# Patient Record
Sex: Female | Born: 1990 | Race: White | Hispanic: No | Marital: Single | Smoking: Former smoker
Health system: Southern US, Community
[De-identification: ages and names within clinical notes are randomized; demographics above are authoritative.]

## PROBLEM LIST (undated history)

## (undated) ENCOUNTER — Inpatient Hospital Stay: Payer: Medicaid Other

## (undated) DIAGNOSIS — K219 Gastro-esophageal reflux disease without esophagitis: Secondary | ICD-10-CM

## (undated) DIAGNOSIS — N730 Acute parametritis and pelvic cellulitis: Secondary | ICD-10-CM

## (undated) DIAGNOSIS — I10 Essential (primary) hypertension: Secondary | ICD-10-CM

## (undated) DIAGNOSIS — F32A Depression, unspecified: Secondary | ICD-10-CM

## (undated) DIAGNOSIS — T7840XA Allergy, unspecified, initial encounter: Secondary | ICD-10-CM

## (undated) HISTORY — DX: Essential (primary) hypertension: I10

## (undated) HISTORY — DX: Depression, unspecified: F32.A

## (undated) HISTORY — PX: MANDIBLE RECONSTRUCTION: SHX431

## (undated) HISTORY — DX: Gastro-esophageal reflux disease without esophagitis: K21.9

## (undated) HISTORY — DX: Allergy, unspecified, initial encounter: T78.40XA

## (undated) HISTORY — PX: OVARIAN CYST REMOVAL: SHX89

---
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
## 2011-02-11 ENCOUNTER — Emergency Department (HOSPITAL_COMMUNITY)
Admission: EM | Admit: 2011-02-11 | Discharge: 2011-02-11 | Disposition: A | Discharge status: Home / Self Care | Payer: 59 | Attending: Emergency Medicine | Admitting: Emergency Medicine

## 2011-02-11 ENCOUNTER — Encounter: Payer: Self-pay | Admitting: Emergency Medicine

## 2011-02-11 ENCOUNTER — Emergency Department (HOSPITAL_COMMUNITY): Payer: 59

## 2011-02-11 DIAGNOSIS — R110 Nausea: Secondary | ICD-10-CM | POA: Insufficient documentation

## 2011-02-11 DIAGNOSIS — R109 Unspecified abdominal pain: Secondary | ICD-10-CM | POA: Insufficient documentation

## 2011-02-11 DIAGNOSIS — N739 Female pelvic inflammatory disease, unspecified: Secondary | ICD-10-CM | POA: Insufficient documentation

## 2011-02-11 DIAGNOSIS — J45909 Unspecified asthma, uncomplicated: Secondary | ICD-10-CM | POA: Insufficient documentation

## 2011-02-11 LAB — URINALYSIS, ROUTINE W REFLEX MICROSCOPIC
Bilirubin Urine: NEGATIVE
Hgb urine dipstick: NEGATIVE
Protein, ur: NEGATIVE mg/dL
Urobilinogen, UA: 0.2 mg/dL (ref 0.0–1.0)

## 2011-02-11 LAB — DIFFERENTIAL
Eosinophils Absolute: 0.2 10*3/uL (ref 0.0–0.7)
Eosinophils Relative: 1 % (ref 0–5)
Lymphs Abs: 3.3 10*3/uL (ref 0.7–4.0)
Monocytes Absolute: 1.7 10*3/uL — ABNORMAL HIGH (ref 0.1–1.0)
Monocytes Relative: 9 % (ref 3–12)

## 2011-02-11 LAB — WET PREP, GENITAL: Trich, Wet Prep: NONE SEEN

## 2011-02-11 LAB — CBC
Hemoglobin: 12.8 g/dL (ref 12.0–15.0)
MCH: 30 pg (ref 26.0–34.0)
MCV: 88.5 fL (ref 78.0–100.0)
Platelets: 306 10*3/uL (ref 150–400)
RBC: 4.27 MIL/uL (ref 3.87–5.11)

## 2011-02-11 LAB — BASIC METABOLIC PANEL
BUN: 11 mg/dL (ref 6–23)
Calcium: 9.6 mg/dL (ref 8.4–10.5)
Creatinine, Ser: 0.78 mg/dL (ref 0.50–1.10)
GFR calc non Af Amer: >90 mL/min (ref >90)
Glucose, Bld: 76 mg/dL (ref 70–99)

## 2011-02-11 MED ORDER — CEFTRIAXONE SODIUM 250 MG IJ SOLR
INTRAMUSCULAR | Status: AC
  Filled 2011-02-11: qty 250

## 2011-02-11 MED ORDER — MORPHINE SULFATE 4 MG/ML IJ SOLN
4.0000 mg | Freq: Once | INTRAMUSCULAR | Status: AC
  Administered 2011-02-11: 4 mg via INTRAVENOUS
  Filled 2011-02-11: qty 1

## 2011-02-11 MED ORDER — MORPHINE SULFATE 4 MG/ML IJ SOLN
4.0000 mg | Freq: Once | INTRAMUSCULAR | Status: DC
  Filled 2011-02-11: qty 1

## 2011-02-11 MED ORDER — ONDANSETRON HCL 4 MG/2ML IJ SOLN
4.0000 mg | Freq: Once | INTRAMUSCULAR | Status: AC
  Administered 2011-02-11: 4 mg via INTRAVENOUS
  Filled 2011-02-11: qty 2

## 2011-02-11 MED ORDER — AZITHROMYCIN 250 MG PO TABS
ORAL_TABLET | ORAL | Status: AC
  Filled 2011-02-11: qty 4

## 2011-02-11 MED ORDER — HYDROCODONE-ACETAMINOPHEN 5-325 MG PO TABS: 2.0000 | ORAL_TABLET | ORAL | Status: AC | PRN

## 2011-02-11 MED ORDER — ONDANSETRON HCL 4 MG/2ML IJ SOLN: 4.0000 mg | Freq: Once | INTRAMUSCULAR | Status: DC

## 2011-02-11 MED ORDER — LIDOCAINE HCL (PF) 1 % IJ SOLN
INTRAMUSCULAR | Status: AC
  Administered 2011-02-11: 0.9 mL
  Filled 2011-02-11: qty 5

## 2011-02-11 MED ORDER — CEFTRIAXONE SODIUM 250 MG IJ SOLR
250.0000 mg | Freq: Once | INTRAMUSCULAR | Status: AC
  Administered 2011-02-11: 250 mg via INTRAMUSCULAR

## 2011-02-11 MED ORDER — DOXYCYCLINE HYCLATE 100 MG PO CAPS: 100.0000 mg | ORAL_CAPSULE | Freq: Two times a day (BID) | ORAL | Status: AC

## 2011-02-11 MED ORDER — AZITHROMYCIN 250 MG PO TABS
1000.0000 mg | ORAL_TABLET | Freq: Once | ORAL | Status: AC
  Administered 2011-02-11: 1000 mg via ORAL

## 2011-02-11 MED ORDER — ONDANSETRON HCL 4 MG PO TABS: 4.0000 mg | ORAL_TABLET | Freq: Three times a day (TID) | ORAL | Status: AC | PRN

## 2011-02-11 MED ORDER — MORPHINE SULFATE 4 MG/ML IJ SOLN
4.0000 mg | Freq: Once | INTRAMUSCULAR | Status: AC
  Administered 2011-02-11: 4 mg via INTRAVENOUS

## 2011-02-11 NOTE — ED Notes (Signed)
Pt moved from Pod A-1 to CDU 4

## 2011-02-11 NOTE — ED Notes (Signed)
PT. REPORTS LOW ABDOMINAL PAIN WITH NAUSEA , DENIES VOMITTING OR DIARRHEA ,   CHILLS WITH HEADACHE.

## 2011-02-11 NOTE — ED Notes (Signed)
PA IN TO DISCUSS RESULTS WITH PT 

## 2011-02-11 NOTE — ED Provider Notes (Signed)
History    this is a 20 year old female presenting to the ED with a chief complaints of abdominal pain. Patient states she experiencing gradual onset of low abdominal pain since yesterday while walking. Pain is described as sharp, worsened with movement, and constant. She is losing some nausea without vomiting or diarrhea. She denies fever, headache, chest pain, shortness of breath, back pain, dysuria, vaginal discharge, rash. She is sexually active and uses protection. She does not think she is pregnant. Her last menstrual period was 1 1/2 weeks ago and was normal.  CSN: [id number] Arrival date & time: 02/11/2011  5:52 AM   First MD Initiated Contact with Patient 02/11/11 [phone]      Chief Complaint  Patient presents with  . Abdominal Pain    (Consider location/radiation/quality/duration/timing/severity/associated sxs/prior treatment) HPI  Past Medical History  Diagnosis Date  . Asthma     History reviewed. No pertinent past surgical history.  No family history on file.  History  Substance Use Topics  . Smoking status: Current Everyday Smoker -- 1.0 packs/day  . Smokeless tobacco: Not on file  . Alcohol Use: Yes     OCCASIONAL    OB History    Grav Para Term Preterm Abortions TAB SAB Ect Mult Living                  Review of Systems  All other systems reviewed and are negative.    Allergies  Review of patient's allergies indicates no known allergies.  Home Medications   Current Outpatient Rx  Name Route Sig Dispense Refill  . ALBUTEROL SULFATE HFA 108 (90 BASE) MCG/ACT IN AERS Inhalation Inhale 2 puffs into the lungs every 6 (six) hours as needed. For shortness of breath or wheeze       BP 138/91  Pulse 78  Temp(Src) 97.5 F (36.4 C) (Oral)  Resp 14  SpO2 99%  LMP 01/28/2011  Physical Exam  Nursing note and vitals reviewed. Constitutional: She appears well-developed and well-nourished. No distress.  HENT:  Head: Normocephalic and atraumatic.  Eyes:  Conjunctivae are normal.  Neck: Normal range of motion. Neck supple.  Cardiovascular: Normal rate and regular rhythm.   Pulmonary/Chest: Effort normal and breath sounds normal. She exhibits no tenderness.  Abdominal: Soft. There is tenderness in the suprapubic area. There is no rigidity, no guarding, no tenderness at McBurney's point and negative Murphy's sign.  Genitourinary: Uterus normal. There is no rash or lesion on the right labia. There is no rash or lesion on the left labia. Cervix exhibits no motion tenderness, no discharge and no friability. Right adnexum displays tenderness. Right adnexum displays no mass. Left adnexum displays tenderness. Left adnexum displays no mass. There is tenderness around the vagina. No erythema or bleeding around the vagina. Vaginal discharge found.  Lymphadenopathy:       Right: No inguinal adenopathy present.       Left: No inguinal adenopathy present.    ED Course  Procedures (including critical care time)  Labs Reviewed  CBC - Abnormal; Notable for the following:    WBC 19.1 (*)    All other components within normal limits  DIFFERENTIAL - Abnormal; Notable for the following:    Neutro Abs 13.8 (*)    Monocytes Absolute 1.7 (*)    All other components within normal limits  BASIC METABOLIC PANEL  URINALYSIS, ROUTINE W REFLEX MICROSCOPIC  POCT PREGNANCY, URINE   No results found.   No diagnosis found.    MDM  Patient with lower abd pain and pain on pelvic exam. She has moderate discharge on pelvic exam. She has an elevated white count. She'll be treated for pelvic inflammatory disease, with ceftriaxone and Zithromax. She'll be discharged with doxycycline 1 week.  A pelvic ultrasound has been ordered to r/o ovarian torsion or tuboovarian abscess. If results normal I will consider an abdominal CT for further evaluation and to r/o appendicitis.  Patient denies prior history of STD.  11:41 AM Pelvic US shows no abnormalities concerning for  tuboovarian abscess or ovarian torsion. Patient were given the options of having an abdominal CT scan to rule out appendicitis. Patient refused CT scan at this time. I gave patient warning signs to return if the symptoms worsen. I will treat her for PID. She is able to tolerate by mouth at this time. She will be discharged with the appropriate medication.      Fayrene Helper, PA 02/11/11 1148

## 2011-02-11 NOTE — ED Notes (Signed)
Pt up ambulatory to the bathroom with no difficulty and no distress attempting to provide an urine specimen at this time

## 2011-02-11 NOTE — ED Notes (Signed)
Pelvic cart set up at bedside  

## 2011-02-11 NOTE — ED Notes (Signed)
Pt has undressed from waist down

## 2011-02-13 LAB — GC/CHLAMYDIA PROBE AMP, GENITAL
Chlamydia, DNA Probe: POSITIVE — AB
GC Probe Amp, Genital: NEGATIVE

## 2011-02-14 NOTE — ED Notes (Signed)
+   Chlamydia Patient treated with Zithromax and rocephin -sensitive to same-chart appended per protocol MD.

## 2011-02-14 NOTE — ED Provider Notes (Signed)
Evaluation and management procedures were performed by the PA/NP under my supervision/collaboration.    Kiwanna Spraker D Nimo Verastegui, MD 02/14/11 1528 

## 2011-02-17 NOTE — ED Notes (Signed)
Patient informed of positive results after id'd x 2 and informed of need to notify partner to be treated. 

## 2011-02-22 ENCOUNTER — Encounter (HOSPITAL_COMMUNITY): Payer: Self-pay | Admitting: Emergency Medicine

## 2011-02-22 ENCOUNTER — Emergency Department (HOSPITAL_COMMUNITY)
Admission: EM | Admit: 2011-02-22 | Discharge: 2011-02-23 | Disposition: A | Discharge status: Home / Self Care | Payer: No Typology Code available for payment source | Attending: Emergency Medicine | Admitting: Emergency Medicine

## 2011-02-22 ENCOUNTER — Emergency Department (HOSPITAL_COMMUNITY): Payer: No Typology Code available for payment source

## 2011-02-22 DIAGNOSIS — S161XXA Strain of muscle, fascia and tendon at neck level, initial encounter: Secondary | ICD-10-CM

## 2011-02-22 DIAGNOSIS — S139XXA Sprain of joints and ligaments of unspecified parts of neck, initial encounter: Secondary | ICD-10-CM | POA: Insufficient documentation

## 2011-02-22 DIAGNOSIS — Y92410 Unspecified street and highway as the place of occurrence of the external cause: Secondary | ICD-10-CM | POA: Insufficient documentation

## 2011-02-22 HISTORY — DX: Acute parametritis and pelvic cellulitis: N73.0

## 2011-02-22 MED ORDER — HYDROCODONE-ACETAMINOPHEN 5-500 MG PO TABS: 1.0000 | ORAL_TABLET | Freq: Four times a day (QID) | ORAL | Status: AC | PRN

## 2011-02-22 MED ORDER — HYDROCODONE-ACETAMINOPHEN 5-325 MG PO TABS
1.0000 | ORAL_TABLET | Freq: Once | ORAL | Status: AC
  Administered 2011-02-22: 1 via ORAL
  Filled 2011-02-22: qty 1

## 2011-02-22 MED ORDER — IBUPROFEN 600 MG PO TABS: ORAL_TABLET | ORAL | Status: DC
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
## 2011-06-09 ENCOUNTER — Ambulatory Visit (INDEPENDENT_AMBULATORY_CARE_PROVIDER_SITE_OTHER): Payer: No Typology Code available for payment source | Admitting: Family Medicine

## 2011-06-09 ENCOUNTER — Encounter: Payer: Self-pay | Admitting: Family Medicine

## 2011-06-09 VITALS — BP 100/78 | HR 60 | Temp 98.0°F | Ht 62.0 in | Wt 123.0 lb

## 2011-06-09 DIAGNOSIS — Z8742 Personal history of other diseases of the female genital tract: Secondary | ICD-10-CM

## 2011-06-09 DIAGNOSIS — N898 Other specified noninflammatory disorders of vagina: Secondary | ICD-10-CM

## 2011-06-09 HISTORY — DX: Personal history of other diseases of the female genital tract: Z87.42

## 2011-06-09 LAB — POCT URINALYSIS DIPSTICK
Ketones, UA: NEGATIVE
Nitrite, UA: POSITIVE
Protein, UA: NEGATIVE
Urobilinogen, UA: NEGATIVE
pH, UA: 6.5

## 2011-06-09 LAB — POCT URINE PREGNANCY: Preg Test, Ur: NEGATIVE

## 2011-06-09 MED ORDER — FLUCONAZOLE 150 MG PO TABS: ORAL_TABLET | ORAL | Status: DC

## 2011-06-09 NOTE — Progress Notes (Signed)
SUBJECTIVE:  21 y.o. female complains of white and copious vaginal discharge for 2 month(s). Denies abnormal vaginal bleeding or significant pelvic pain or fever. No UTI symptoms.   H/o PID  Patient Active Problem List  Diagnoses  . History of PID   Past Medical History  Diagnosis Date  . Asthma   . PID (acute pelvic inflammatory disease)    No past surgical history on file. History  Substance Use Topics  . Smoking status: Current Everyday Smoker  . Smokeless tobacco: Not on file   Comment: Occasional  . Alcohol Use: Yes     OCCASIONAL   Family History  Problem Relation Age of Onset  . Cancer Mother 6    breast   No Known Allergies Current Outpatient Prescriptions on File Prior to Visit  Medication Sig Dispense Refill  . albuterol (PROVENTIL HFA;VENTOLIN HFA) 108 (90 BASE) MCG/ACT inhaler Inhale 2 puffs into the lungs every 6 (six) hours as needed. For shortness of breath or wheeze       . ibuprofen (ADVIL,MOTRIN) 600 MG tablet One (1) po tid prn, with food  15 tablet  0  . ondansetron (ZOFRAN) 4 MG tablet Take 4 mg by mouth every 8 (eight) hours as needed. nausea        The PMH, PSH, Social History, Family History, Medications, and allergies have been reviewed in [facility], and have been updated if relevant.  Patient's last menstrual period was 05/21/2011.  OBJECTIVE:  BP 100/78  Pulse 60  Temp(Src) 98 F (36.7 C) (Oral)  Ht 5\' 2"  (1.575 m)  Wt 123 lb (55.792 kg)  BMI 22.50 kg/m2  LMP 05/21/2011  She appears well, afebrile. Abdomen: benign, soft, nontender, no masses. Pelvic Exam: normal external genitalia, vulva, vagina, cervix, uterus and adnexa, thick white discharge in vault, no CMT WET MOUNT done - results: mobiluncus noted. Urine dipstick: positive for leukocytes.  ASSESSMENT:  rule out GC or chlamydia and C. albicans vulvovaginitis  PLAN:  GC and chlamydia DNA  probe sent to lab. Treatment: abstain from coitus during course of treatment and  diflucan. ROV prn if symptoms persist or worsen. Pt declined RPR, HIV testing.

## 2011-06-09 NOTE — Patient Instructions (Signed)
It was great to meet you. We will call you with results from your tests today. Please take Diflucan as directed.

## 2011-07-24 ENCOUNTER — Emergency Department (HOSPITAL_COMMUNITY)
Admission: EM | Admit: 2011-07-24 | Discharge: 2011-07-24 | Disposition: A | Discharge status: Home / Self Care | Payer: 59 | Attending: Emergency Medicine | Admitting: Emergency Medicine

## 2011-07-24 ENCOUNTER — Encounter (HOSPITAL_COMMUNITY): Payer: Self-pay | Admitting: *Deleted

## 2011-07-24 DIAGNOSIS — F411 Generalized anxiety disorder: Secondary | ICD-10-CM | POA: Insufficient documentation

## 2011-07-24 DIAGNOSIS — J45909 Unspecified asthma, uncomplicated: Secondary | ICD-10-CM | POA: Insufficient documentation

## 2011-07-24 DIAGNOSIS — F17200 Nicotine dependence, unspecified, uncomplicated: Secondary | ICD-10-CM | POA: Insufficient documentation

## 2011-07-24 DIAGNOSIS — F410 Panic disorder [episodic paroxysmal anxiety] without agoraphobia: Secondary | ICD-10-CM

## 2011-07-24 NOTE — ED Notes (Signed)
Bed:WA13<BR> Expected date:<BR> Expected time:<BR> Means of arrival:<BR> Comments:<BR> EMS

## 2011-07-24 NOTE — ED Notes (Signed)
Per EMS: Pt has been drinking since 12pm and then snorted something that she was told was cocaine around 2 hours ago.  Pt st's wasn't herself after snorting the drug and started having a panic attack, st's she couldn't breathe.

## 2011-07-24 NOTE — Discharge Instructions (Signed)
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
## 2011-07-31 ENCOUNTER — Emergency Department: Payer: Self-pay | Admitting: *Deleted

## 2011-07-31 ENCOUNTER — Other Ambulatory Visit: Payer: Self-pay

## 2011-07-31 MED ORDER — ALBUTEROL SULFATE HFA 108 (90 BASE) MCG/ACT IN AERS: 2.0000 | INHALATION_SPRAY | Freq: Four times a day (QID) | RESPIRATORY_TRACT | Status: DC | PRN

## 2011-07-31 NOTE — Telephone Encounter (Signed)
Pts mother left v/m requesting Ventolin inhaler(used prn) to [facility]. Wants to pick up today.Please advise.

## 2011-08-12 ENCOUNTER — Other Ambulatory Visit: Payer: Self-pay | Admitting: *Deleted

## 2011-08-13 ENCOUNTER — Other Ambulatory Visit: Payer: Self-pay | Admitting: *Deleted

## 2011-08-13 DIAGNOSIS — J45909 Unspecified asthma, uncomplicated: Secondary | ICD-10-CM

## 2011-08-13 NOTE — Telephone Encounter (Signed)
Pharmacy is asking for proventil nebulizer solution for patient.  This isn't on med list.  I called her mother, she says pt went to ER a couple of weeks ago with breathing problems, left without being seen.  Mother says she has a history of having mild asthma, has used a nebulizer in the past.  Pt is going on vacation and wants to have this on hand when she goes.  Form form [facility] is on your desk.

## 2011-08-14 DIAGNOSIS — J45909 Unspecified asthma, uncomplicated: Secondary | ICD-10-CM | POA: Insufficient documentation

## 2011-08-14 DIAGNOSIS — J4540 Moderate persistent asthma, uncomplicated: Secondary | ICD-10-CM | POA: Insufficient documentation

## 2011-08-14 NOTE — Telephone Encounter (Signed)
Formed filled out and in my box.

## 2011-08-14 NOTE — Telephone Encounter (Signed)
Form faxed

## 2011-09-23 ENCOUNTER — Other Ambulatory Visit: Payer: Self-pay | Admitting: Family Medicine

## 2011-11-04 ENCOUNTER — Telehealth: Payer: Self-pay

## 2011-11-04 NOTE — Telephone Encounter (Signed)
Pts nebulizer has broken(pt has had for at least 10 years);pts mother spoke with [facility] and request an order for new nebulizer.call when ready for pick up.

## 2011-11-05 ENCOUNTER — Other Ambulatory Visit: Payer: Self-pay | Admitting: *Deleted

## 2011-11-05 MED ORDER — ALBUTEROL SULFATE HFA 108 (90 BASE) MCG/ACT IN AERS: INHALATION_SPRAY | RESPIRATORY_TRACT | Status: DC

## 2011-11-05 NOTE — Telephone Encounter (Signed)
Rx in my box.

## 2011-11-05 NOTE — Telephone Encounter (Signed)
Advised mother order is ready for pick up.

## 2011-11-21 ENCOUNTER — Encounter: Payer: Self-pay | Admitting: Family Medicine

## 2011-11-21 ENCOUNTER — Ambulatory Visit (INDEPENDENT_AMBULATORY_CARE_PROVIDER_SITE_OTHER): Payer: 59 | Admitting: Family Medicine

## 2011-11-21 VITALS — BP 120/68 | HR 96 | Temp 98.2°F | Resp 20 | Ht 62.0 in | Wt 126.2 lb

## 2011-11-21 DIAGNOSIS — J329 Chronic sinusitis, unspecified: Secondary | ICD-10-CM

## 2011-11-21 DIAGNOSIS — J40 Bronchitis, not specified as acute or chronic: Secondary | ICD-10-CM

## 2011-11-21 MED ORDER — AZITHROMYCIN 250 MG PO TABS: ORAL_TABLET | ORAL | Status: DC

## 2011-11-21 NOTE — Progress Notes (Signed)
SUBJECTIVE:  Kathryn Bush is a 21 y.o. female who complains of coryza, congestion, sneezing, sore throat, headache and bilateral sinus pain for 9 days. She denies a history of anorexia, chest pain, shortness of breath, sweats, vomiting and weakness and has a history of asthma. Patient denies smoke cigarettes.   Patient Active Problem List  Diagnosis  . History of PID  . Asthma   Past Medical History  Diagnosis Date  . Asthma   . PID (acute pelvic inflammatory disease)    No past surgical history on file. History  Substance Use Topics  . Smoking status: Former Smoker    Start date: 09/25/2011  . Smokeless tobacco: Not on file   Comment: Occasional  . Alcohol Use: Yes     OCCASIONAL   Family History  Problem Relation Age of Onset  . Cancer Mother 76    breast   No Known Allergies Current Outpatient Prescriptions on File Prior to Visit  Medication Sig Dispense Refill  . albuterol (PROVENTIL HFA) 108 (90 BASE) MCG/ACT inhaler Inhale 2 puffs into the lungs every 6 hours as needed for shortness of breath or wheezing.  3 Inhaler  0  . albuterol (PROVENTIL) (2.5 MG/3ML) 0.083% nebulizer solution Take 2.5 mg by nebulization every 6 (six) hours as needed.       The PMH, PSH, Social History, Family History, Medications, and allergies have been reviewed in [facility], and have been updated if relevant.  OBJECTIVE: BP 120/68  Pulse 96  Temp 98.2 F (36.8 C) (Oral)  Resp 20  Ht 5\' 2"  (1.575 m)  Wt 126 lb 4 oz (57.267 kg)  BMI 23.09 kg/m2  SpO2 97%  She appears well, vital signs are as noted. Ears normal.  Throat and pharynx normal.  Neck supple. No adenopathy in the neck. Nose is congested. Sinuses  Tender throughout. Left scattered wheezes, faint, otherwise clear.  ASSESSMENT:  sinusitis and bronchitis  PLAN: Given duration and progression of symptoms, will treat for bacterial sinusitis/bronchitis with Zpack. Discussed local increased macrolide resistance.  She will call us next  week if symptoms appear to be progressing and not improving.  Symptomatic therapy suggested: push fluids and rest. Call or return to clinic prn if these symptoms worsen or fail to improve as anticipated.

## 2011-11-21 NOTE — Patient Instructions (Addendum)
Take Zpack as directed.  Drink lots of fluids.  Treat sympotmatically with Mucinex, nasal saline irrigation, and Tylenol/Ibuprofen. Also try claritin D or zyrtec D over the counter- two times a day as needed ( have to sign for them at pharmacy). You can use warm compresses.  Cough suppressant at night. Call if not improving as expected in 5-7 days.    

## 2011-11-24 ENCOUNTER — Telehealth: Payer: Self-pay | Admitting: *Deleted

## 2011-11-24 MED ORDER — BENZONATATE 200 MG PO CAPS: 200.0000 mg | ORAL_CAPSULE | Freq: Two times a day (BID) | ORAL | Status: DC | PRN

## 2011-11-24 NOTE — Telephone Encounter (Signed)
Spoke with patient, she says she has a dry cough that is worse than it has been, sometimes coughs so much that she feels short of breath.  No fever. Would like something for the cough called to [facility] [address]  She is taking mucinex and drinking plenty of fluids.

## 2011-11-24 NOTE — Telephone Encounter (Signed)
Pt was seen on Friday for URI and her mother states she was told to call today if not any better, and she says pt is not.  Still with same symptoms as Friday.  Taking zithromax since Friday.   Please advise, uses [facility] [address]

## 2011-11-24 NOTE — Telephone Encounter (Signed)
Noted.  Rx for tessalon sent to her pharmacy.

## 2011-11-24 NOTE — Telephone Encounter (Signed)
Advised patient's mother, she says pt is worse.  She says that you had told her pt could get a stronger abx if needed today.  She hasn't seen patient but says she sounds much worse then she did.

## 2011-11-24 NOTE — Telephone Encounter (Signed)
It takes 5-7 days for symptoms to resolve.  As long as she is not worse, please continue Zpack and supportive care as directed.

## 2011-11-24 NOTE — Telephone Encounter (Signed)
Left message on cell phone asking patient to call be back.

## 2011-11-24 NOTE — Telephone Encounter (Signed)
Left message advising pt to check with her pharmacy regarding script.

## 2011-11-24 NOTE — Telephone Encounter (Signed)
Please speak to patient directly and ask her how she is doing. We need to hear how she is doing from her.

## 2011-12-23 ENCOUNTER — Emergency Department (HOSPITAL_COMMUNITY): Payer: 59

## 2011-12-23 ENCOUNTER — Encounter (HOSPITAL_COMMUNITY): Payer: Self-pay | Admitting: *Deleted

## 2011-12-23 ENCOUNTER — Emergency Department (HOSPITAL_COMMUNITY)
Admission: EM | Admit: 2011-12-23 | Discharge: 2011-12-24 | Disposition: A | Discharge status: Home / Self Care | Payer: 59 | Attending: Emergency Medicine | Admitting: Emergency Medicine

## 2011-12-23 DIAGNOSIS — R0602 Shortness of breath: Secondary | ICD-10-CM | POA: Insufficient documentation

## 2011-12-23 DIAGNOSIS — R062 Wheezing: Secondary | ICD-10-CM | POA: Insufficient documentation

## 2011-12-23 DIAGNOSIS — R0789 Other chest pain: Secondary | ICD-10-CM | POA: Insufficient documentation

## 2011-12-23 DIAGNOSIS — Z79899 Other long term (current) drug therapy: Secondary | ICD-10-CM | POA: Insufficient documentation

## 2011-12-23 DIAGNOSIS — F17200 Nicotine dependence, unspecified, uncomplicated: Secondary | ICD-10-CM | POA: Insufficient documentation

## 2011-12-23 DIAGNOSIS — Z87441 Personal history of nephrotic syndrome: Secondary | ICD-10-CM | POA: Insufficient documentation

## 2011-12-23 DIAGNOSIS — J45901 Unspecified asthma with (acute) exacerbation: Secondary | ICD-10-CM

## 2011-12-23 LAB — BASIC METABOLIC PANEL
BUN: 3 mg/dL — ABNORMAL LOW (ref 6–23)
Calcium: 9.5 mg/dL (ref 8.4–10.5)
Chloride: 98 mEq/L (ref 96–112)
Creatinine, Ser: 0.59 mg/dL (ref 0.50–1.10)
GFR calc Af Amer: >90 mL/min (ref >90)

## 2011-12-23 LAB — CBC
HCT: 38 % (ref 36.0–46.0)
MCH: 29.6 pg (ref 26.0–34.0)
MCV: 86.6 fL (ref 78.0–100.0)
Platelets: 277 10*3/uL (ref 150–400)
RDW: 14.4 % (ref 11.5–15.5)
WBC: 16.3 10*3/uL — ABNORMAL HIGH (ref 4.0–10.5)

## 2011-12-23 MED ORDER — PREDNISONE 20 MG PO TABS
60.0000 mg | ORAL_TABLET | Freq: Once | ORAL | Status: AC
  Administered 2011-12-23: 60 mg via ORAL
  Filled 2011-12-23: qty 3

## 2011-12-23 MED ORDER — IPRATROPIUM BROMIDE 0.02 % IN SOLN
0.5000 mg | RESPIRATORY_TRACT | Status: DC
  Administered 2011-12-23 (×2): 0.5 mg via RESPIRATORY_TRACT
  Filled 2011-12-23 (×2): qty 2.5

## 2011-12-23 MED ORDER — IPRATROPIUM BROMIDE 0.02 % IN SOLN
0.5000 mg | Freq: Once | RESPIRATORY_TRACT | Status: AC
  Administered 2011-12-23: 0.5 mg via RESPIRATORY_TRACT
  Filled 2011-12-23: qty 2.5

## 2011-12-23 MED ORDER — ALBUTEROL SULFATE (5 MG/ML) 0.5% IN NEBU
10.0000 mg | INHALATION_SOLUTION | Freq: Once | RESPIRATORY_TRACT | Status: AC
  Administered 2011-12-23: 10 mg via RESPIRATORY_TRACT

## 2011-12-23 MED ORDER — ALBUTEROL SULFATE (5 MG/ML) 0.5% IN NEBU
2.5000 mg | INHALATION_SOLUTION | RESPIRATORY_TRACT | Status: DC
  Administered 2011-12-23 (×2): 2.5 mg via RESPIRATORY_TRACT
  Filled 2011-12-23 (×2): qty 0.5

## 2011-12-23 NOTE — ED Provider Notes (Signed)
History   This chart was scribed for non-physician practitioner working with Lyanne Co, MD by Greer Ee. This patient was seen in room WTR6/WTR6 and the patient's care was started at 20:56.    CSN: [id number]  Arrival date & time 12/23/11  2048   First MD Initiated Contact with Patient 12/23/11 2055      Chief Complaint  Patient presents with  . Asthma    (Consider location/radiation/quality/duration/timing/severity/associated sxs/prior treatment) The history is provided by the patient. No language interpreter was used.   Kathryn Bush is a 21 y.o. female with h/o asthma who presents to the Emergency Department complaining of 2 days of constant dyspnea.  Pt states asthma is usually easily controlled by albuterol inhaler, which she uses regularly at baseline, and when dyspnea gets more severe she uses nebulizer with improvements, however current dyspnea has not been improved by inhaler or nebulizer.  Pt states her asthma usually worsens when the seasons change. Admits to associated productive cough with green mucus. Pt reports she is a current someday smoker.    Past Medical History  Diagnosis Date  . Asthma   . PID (acute pelvic inflammatory disease)     History reviewed. No pertinent past surgical history.  Family History  Problem Relation Age of Onset  . Cancer Mother 33    breast    History  Substance Use Topics  . Smoking status: Current Some Day Smoker    Types: Cigarettes    Start date: 09/25/2011  . Smokeless tobacco: Not on file   Comment: Occasional  . Alcohol Use: Yes     OCCASIONAL    No OB history provided.  Review of Systems  Respiratory: Positive for chest tightness, shortness of breath and wheezing.   All other systems reviewed and are negative.    Allergies  Review of patient's allergies indicates no known allergies.  Home Medications   Current Outpatient Rx  Name Route Sig Dispense Refill  . ALBUTEROL SULFATE HFA 108 (90 BASE)  MCG/ACT IN AERS  Inhale 2 puffs into the lungs every 6 hours as needed for shortness of breath or wheezing. 3 Inhaler 0  . ALBUTEROL SULFATE (2.5 MG/3ML) 0.083% IN NEBU Nebulization Take 2.5 mg by nebulization every 6 (six) hours as needed.    Marland Kitchen BENZONATATE 200 MG PO CAPS Oral Take 1 capsule (200 mg total) by mouth 2 (two) times daily as needed for cough. 20 capsule 0  . NYQUIL PO Oral Take by mouth. As directed    . DAYQUIL PO Oral Take by mouth. As directed OTC      BP 102/88  Pulse 104  Temp 98.4 F (36.9 C)  Resp 20  SpO2 98%  LMP 12/07/2011  Physical Exam  Nursing note and vitals reviewed. Constitutional: She is oriented to person, place, and time. She appears well-developed and well-nourished.  HENT:  Head: Normocephalic and atraumatic.  Eyes: EOM are normal.  Neck: Normal range of motion. No tracheal deviation present.  Cardiovascular: Normal rate, regular rhythm and normal heart sounds.   No murmur heard. Pulmonary/Chest: Effort normal. No respiratory distress. She has wheezes.       Bilateral wheezing in all fields.  Musculoskeletal: Normal range of motion.  Neurological: She is alert and oriented to person, place, and time.  Skin: Skin is warm.  Psychiatric: She has a normal mood and affect.    ED Course  Procedures (including critical care time) DIAGNOSTIC STUDIES: Oxygen Saturation is 98% on room air, normal  by my interpretation.    COORDINATION OF CARE: 21:00- Patient informed of clinical course, understand medical decision-making process, and agree with plan.  Ordered breathing treatment and PO prednisone.    Labs Reviewed  CBC - Abnormal; Notable for the following:    WBC 16.3 (*)     All other components within normal limits  BASIC METABOLIC PANEL - Abnormal; Notable for the following:    Sodium 134 (*)     Potassium 3.3 (*)     Glucose, Bld 105 (*)     BUN 3 (*)     All other components within normal limits   Dg Chest 2 View  12/23/2011   *RADIOLOGY REPORT*  Clinical Data: Shortness of breath.  Asthma.  CHEST - 2 VIEW  Comparison: None.  Findings: Lungs are clear.  No pneumothorax or pleural fluid. Heart size normal.  No focal bony abnormality.  IMPRESSION: Negative chest.   Original Report Authenticated By: Bernadene Bell. D'ALESSIO, M.D.      1. Asthma exacerbation       MDM  Patient's asthma exacerbation beginning to improve after a few nebulizer treatments in the emergency department. She was given 60 mg of oral prednisone. Currently she states she still wheezing, however it is much better than before. She is no longer short of breath. She'll be discharged with 5 days of prednisone. She has inhalers at home along with nebulizer. Advised her to followup with her primary care doctor. Case discussed with Dr. Patria Mane who also evaluated patient and agrees plan of care.   I personally performed the services described in this documentation, which was scribed in my presence. The recorded information has been reviewed and considered. Lyanne Co, MD   Trevor Mace, PA-C 12/24/11 [phone]

## 2011-12-23 NOTE — ED Notes (Signed)
Returned from XR 

## 2011-12-23 NOTE — ED Notes (Signed)
Verbal order to stop breathing treatment after noticing pts HR 127. Breathing treatment stopped and HR decreased to 107. PA aware.

## 2011-12-23 NOTE — ED Notes (Signed)
Patient transported to X-ray 

## 2011-12-23 NOTE — ED Notes (Signed)
Pt c/o worsening asthma x 2 days; no improvement after three albuterol neb treatments and enhaler at home; speaking full sentences

## 2011-12-24 ENCOUNTER — Ambulatory Visit: Payer: 59 | Admitting: Family Medicine

## 2011-12-24 MED ORDER — PREDNISONE 10 MG PO TABS: 40.0000 mg | ORAL_TABLET | Freq: Every day | ORAL | Status: DC

## 2011-12-24 NOTE — ED Provider Notes (Addendum)
Medical screening examination/treatment/procedure(s) were conducted as a shared visit with non-physician practitioner(s) and myself.  I personally evaluated the patient during the encounter  The patient feels much better after albuterol treatments in the emergency department.  Steris given in emergency department.  Discharge home with schedule albuterol and prednisone.  Understands return to the ER for new or worsening symptoms.   Lyanne Co, MD 12/24/11 0022  Lyanne Co, MD 12/24/11 [phone]

## 2011-12-26 ENCOUNTER — Telehealth: Payer: Self-pay | Admitting: Family Medicine

## 2011-12-26 ENCOUNTER — Encounter: Payer: Self-pay | Admitting: Family Medicine

## 2011-12-26 ENCOUNTER — Ambulatory Visit (INDEPENDENT_AMBULATORY_CARE_PROVIDER_SITE_OTHER): Payer: 59 | Admitting: Family Medicine

## 2011-12-26 VITALS — BP 120/80 | HR 88 | Temp 97.9°F | Resp 16 | Wt 129.8 lb

## 2011-12-26 DIAGNOSIS — J45909 Unspecified asthma, uncomplicated: Secondary | ICD-10-CM

## 2011-12-26 DIAGNOSIS — J45901 Unspecified asthma with (acute) exacerbation: Secondary | ICD-10-CM

## 2011-12-26 MED ORDER — PREDNISONE 20 MG PO TABS: ORAL_TABLET | ORAL | Status: DC

## 2011-12-26 MED ORDER — AMOXICILLIN 500 MG PO CAPS: 1000.0000 mg | ORAL_CAPSULE | Freq: Two times a day (BID) | ORAL | Status: DC

## 2011-12-26 MED ORDER — METHYLPREDNISOLONE ACETATE 40 MG/ML IJ SUSP
60.0000 mg | Freq: Once | INTRAMUSCULAR | Status: AC
  Administered 2011-12-26: 60 mg via INTRAMUSCULAR

## 2011-12-26 MED ORDER — BUDESONIDE-FORMOTEROL FUMARATE 160-4.5 MCG/ACT IN AERO: 2.0000 | INHALATION_SPRAY | Freq: Two times a day (BID) | RESPIRATORY_TRACT | Status: DC

## 2011-12-26 NOTE — Telephone Encounter (Signed)
Called Pt mother has an appt with Dr. Ermalene Searing at [facility] on 12/26/11.

## 2011-12-26 NOTE — Progress Notes (Signed)
  Subjective:    Patient ID: Kathryn Bush, female    DOB: Sep 15, 1990, 21 y.o.   MRN: [id number]  HPI 21 year old female presents to office following ER visit on 10/29 for asthma exacerbation.  Given breathing treatments (albuterol and atrovent) and started on prednisone 40 mg daily.  Prior to ER she was doing fairly well but awoke with shortness of breath, she did clean that day.has not improved at all.   She reports breathing not any better. Congestion, cough, productive yellow. No fever. B ear pain, mild face pain.  Chest tightness.  She is using the nebulizer 4 times a day.    In past was on Advair but felt it did not help.   She does have fall allergies. Claritin benadryl. Singulair, allegra, zyrtec.  Nasal sprays make her nauseous.  Even prior to ER visit he was having frequent isues. Every day using albutrol inhaler first thing in AM.   Review of Systems  Constitutional: Negative for fever and fatigue.  HENT: Negative for ear pain.   Eyes: Negative for pain.  Respiratory: Positive for chest tightness and shortness of breath.   Cardiovascular: Negative for chest pain, palpitations and leg swelling.  Gastrointestinal: Negative for abdominal pain.  Genitourinary: Negative for dysuria.       Objective:   Physical Exam  Constitutional: Vital signs are normal. She appears well-developed and well-nourished. She is cooperative.  Non-toxic appearance. She does not appear ill. No distress.  HENT:  Head: Normocephalic.  Right Ear: Hearing, tympanic membrane, external ear and ear canal normal. Tympanic membrane is not erythematous, not retracted and not bulging.  Left Ear: Hearing, tympanic membrane, external ear and ear canal normal. Tympanic membrane is not erythematous, not retracted and not bulging.  Nose: Mucosal edema and rhinorrhea present. Right sinus exhibits no maxillary sinus tenderness and no frontal sinus tenderness. Left sinus exhibits no maxillary sinus tenderness and  no frontal sinus tenderness.  Mouth/Throat: Uvula is midline, oropharynx is clear and moist and mucous membranes are normal.  Eyes: Conjunctivae normal, EOM and lids are normal. Pupils are equal, round, and reactive to light. No foreign bodies found.  Neck: Trachea normal and normal range of motion. Neck supple. Carotid bruit is not present. No mass and no thyromegaly present.  Cardiovascular: Normal rate, regular rhythm, S1 normal, S2 normal, normal heart sounds, intact distal pulses and normal pulses.  Exam reveals no gallop and no friction rub.   No murmur heard. Pulmonary/Chest: Effort normal. Not tachypneic. No respiratory distress. She has no decreased breath sounds. She has wheezes in the right upper field, the right middle field, the right lower field, the left upper field, the left middle field and the left lower field. She has no rhonchi. She has no rales.  Neurological: She is alert.  Skin: Skin is warm, dry and intact. No rash noted.  Psychiatric: Her speech is normal and behavior is normal. Judgment normal. Her mood appears not anxious. Cognition and memory are normal. She does not exhibit a depressed mood.          Assessment & Plan:

## 2011-12-26 NOTE — Telephone Encounter (Signed)
PLEASE CALL MOM/LISA AT [phone] Mom/Lisa calling on emergent line and states Lamika was seen in ED this past week - unsure of date- with severe breathing symptoms. Was ordered Prednisone and Albuterol Nebs x 9 treatments. Has not finished Prednisone. No improvement noted. Shirlyn has called [facility] and states she is having breathing problems. Ashlie is not with [facility] for triage. Requesting office appointment for  today 12/26/11. No appt available in [facility].  Please call Mom/Lisa.

## 2011-12-26 NOTE — Patient Instructions (Addendum)
Given depomedrol steroid today.  Continue albuterol nebs as needed.  Change prednsione taper to 60 mg x 5 days then down per prescription. Start this tommorow.  Start amoxicillin.  Start Symbicort 2 puff twice daily  Follow up in 1 week, earlier if not improving.  If severe shortness of breath go to ER.

## 2012-01-20 DIAGNOSIS — J45901 Unspecified asthma with (acute) exacerbation: Secondary | ICD-10-CM | POA: Insufficient documentation

## 2012-01-20 NOTE — Assessment & Plan Note (Addendum)
Peal flow diminished. Given depomedrol steroid today.  Continue albuterol nebs as needed.  Change prednsione taper to 60 mg x 5 days then down per prescription. Start this tommorow.  Start amoxicillin.  Start Symbicort 2 puff twice daily for longterm control.  Follow up in 1 week, earlier if not improving.  If severe shortness of breath go to ER.

## 2012-02-23 ENCOUNTER — Ambulatory Visit: Payer: 59 | Admitting: Family Medicine

## 2012-02-24 ENCOUNTER — Ambulatory Visit: Payer: 59 | Admitting: Family Medicine

## 2012-02-26 ENCOUNTER — Other Ambulatory Visit: Payer: Self-pay | Admitting: *Deleted

## 2012-02-26 MED ORDER — BUDESONIDE-FORMOTEROL FUMARATE 160-4.5 MCG/ACT IN AERO: 2.0000 | INHALATION_SPRAY | Freq: Two times a day (BID) | RESPIRATORY_TRACT | Status: DC

## 2012-03-04 ENCOUNTER — Other Ambulatory Visit: Payer: Self-pay | Admitting: *Deleted

## 2012-03-04 MED ORDER — BUDESONIDE-FORMOTEROL FUMARATE 160-4.5 MCG/ACT IN AERO: 2.0000 | INHALATION_SPRAY | Freq: Two times a day (BID) | RESPIRATORY_TRACT | Status: DC

## 2012-05-17 ENCOUNTER — Ambulatory Visit: Payer: 59 | Admitting: Family Medicine

## 2012-05-18 ENCOUNTER — Ambulatory Visit: Payer: 59 | Admitting: Family Medicine

## 2012-05-24 ENCOUNTER — Ambulatory Visit: Payer: 59 | Admitting: Family Medicine

## 2012-05-25 ENCOUNTER — Ambulatory Visit: Payer: 59 | Admitting: Family Medicine

## 2012-05-26 ENCOUNTER — Other Ambulatory Visit: Payer: Self-pay | Admitting: Family Medicine

## 2012-05-27 ENCOUNTER — Ambulatory Visit: Payer: 59 | Admitting: Family Medicine

## 2012-05-27 DIAGNOSIS — Z0289 Encounter for other administrative examinations: Secondary | ICD-10-CM

## 2012-05-28 ENCOUNTER — Ambulatory Visit: Payer: 59 | Admitting: Family Medicine

## 2012-08-03 ENCOUNTER — Other Ambulatory Visit: Payer: Self-pay | Admitting: *Deleted

## 2012-08-18 ENCOUNTER — Emergency Department: Payer: Self-pay | Admitting: Unknown Physician Specialty

## 2012-08-18 LAB — DRUG SCREEN, URINE
Amphetamines, Ur Screen: NEGATIVE (ref ?–1000)
Cocaine Metabolite,Ur ~~LOC~~: NEGATIVE (ref ?–300)
MDMA (Ecstasy)Ur Screen: NEGATIVE (ref ?–500)
Methadone, Ur Screen: NEGATIVE (ref ?–300)
Opiate, Ur Screen: NEGATIVE (ref ?–300)
Phencyclidine (PCP) Ur S: NEGATIVE (ref ?–25)
Tricyclic, Ur Screen: NEGATIVE (ref ?–1000)

## 2012-08-18 LAB — ETHANOL: Ethanol %: 0.165 % — ABNORMAL HIGH (ref 0.000–0.080)

## 2012-11-24 ENCOUNTER — Other Ambulatory Visit: Payer: Self-pay | Admitting: Family Medicine

## 2012-11-24 DIAGNOSIS — Z136 Encounter for screening for cardiovascular disorders: Secondary | ICD-10-CM

## 2012-11-24 DIAGNOSIS — Z0000 Encounter for general adult medical examination without abnormal findings: Secondary | ICD-10-CM | POA: Insufficient documentation

## 2012-12-02 ENCOUNTER — Other Ambulatory Visit: Payer: 59

## 2012-12-03 ENCOUNTER — Telehealth: Payer: Self-pay | Admitting: Family Medicine

## 2012-12-03 DIAGNOSIS — Z136 Encounter for screening for cardiovascular disorders: Secondary | ICD-10-CM

## 2012-12-03 DIAGNOSIS — Z0000 Encounter for general adult medical examination without abnormal findings: Secondary | ICD-10-CM

## 2012-12-03 NOTE — Telephone Encounter (Signed)
Thanks Terri.  Order entered.  Please see below about scheduling appt.

## 2012-12-03 NOTE — Telephone Encounter (Signed)
That would be a question for [facility].  I too have never heard of [place] ordering labs at [facility].  I usually write a prescription for labs and give it to my CMA.  I am not sure what happens at that point.

## 2012-12-03 NOTE — Telephone Encounter (Signed)
Information given to pt's mother.  Verbalized understanding.

## 2012-12-03 NOTE — Telephone Encounter (Signed)
Pt is coming in on Thursday, October 16th for a CPE and pt's mother has called wanting  Lab orders sent to [facility].  She also wants [place] to make the appointment for the lab.  Mom says she would like the apptmt to be made for Monday at 1:30 p.m.  We do not usually schedule those apptmts but Mom insists that we schedule the apptmt.  Are was supposed to do this? I don't mind and am willing.  She wants the apptmt at [facility] location in [place], close to their home.

## 2012-12-03 NOTE — Telephone Encounter (Signed)
Terri or Rodney Booze, can you help with this?

## 2012-12-03 NOTE — Telephone Encounter (Signed)
Dr Dayton Martes, Just order the tests you want, make sure its going to [facility] and select, "Lab Collect" "Normal "(not future or standing). This will stay in the [facility] system for 6 months. As far as the appointment, they need to contact that drawing station. We do not schedule appt. For any outside labs.

## 2012-12-08 LAB — CBC WITH DIFFERENTIAL/PLATELET
Basos: 0 %
Eos: 4 %
HCT: 42.3 % (ref 34.0–46.6)
Lymphocytes Absolute: 2.3 10*3/uL (ref 0.7–3.1)
MCH: 30.8 pg (ref 26.6–33.0)
MCV: 95 fL (ref 79–97)
Monocytes Absolute: 0.8 10*3/uL (ref 0.1–0.9)
Neutrophils Absolute: 6 10*3/uL (ref 1.4–7.0)
RBC: 4.45 x10E6/uL (ref 3.77–5.28)
WBC: 9.5 10*3/uL (ref 3.4–10.8)

## 2012-12-08 LAB — COMPREHENSIVE METABOLIC PANEL
ALT: 14 IU/L (ref 0–32)
AST: 16 IU/L (ref 0–40)
Albumin/Globulin Ratio: 1.7 (ref 1.1–2.5)
Calcium: 9.5 mg/dL (ref 8.7–10.2)
Chloride: 101 mmol/L (ref 97–108)
GFR calc Af Amer: 146 mL/min/{1.73_m2} (ref >59)
GFR calc non Af Amer: 127 mL/min/{1.73_m2} (ref >59)
Glucose: 96 mg/dL (ref 65–99)
Potassium: 4.6 mmol/L (ref 3.5–5.2)
Sodium: 137 mmol/L (ref 134–144)
Total Bilirubin: 0.3 mg/dL (ref 0.0–1.2)

## 2012-12-08 LAB — LIPID PANEL: Cholesterol, Total: 207 mg/dL — ABNORMAL HIGH (ref 100–189)

## 2012-12-09 ENCOUNTER — Other Ambulatory Visit (HOSPITAL_COMMUNITY)
Admission: RE | Admit: 2012-12-09 | Discharge: 2012-12-09 | Disposition: A | Discharge status: Home / Self Care | Payer: 59 | Source: Ambulatory Visit | Attending: Family Medicine | Admitting: Family Medicine

## 2012-12-09 ENCOUNTER — Ambulatory Visit (INDEPENDENT_AMBULATORY_CARE_PROVIDER_SITE_OTHER): Payer: 59 | Admitting: Family Medicine

## 2012-12-09 ENCOUNTER — Encounter: Payer: Self-pay | Admitting: Family Medicine

## 2012-12-09 ENCOUNTER — Encounter: Payer: 59 | Admitting: Family Medicine

## 2012-12-09 VITALS — BP 122/82 | HR 83 | Temp 98.4°F | Ht 62.5 in | Wt 158.0 lb

## 2012-12-09 DIAGNOSIS — Z3009 Encounter for other general counseling and advice on contraception: Secondary | ICD-10-CM

## 2012-12-09 DIAGNOSIS — Z13220 Encounter for screening for lipoid disorders: Secondary | ICD-10-CM | POA: Insufficient documentation

## 2012-12-09 DIAGNOSIS — Z01419 Encounter for gynecological examination (general) (routine) without abnormal findings: Secondary | ICD-10-CM | POA: Insufficient documentation

## 2012-12-09 DIAGNOSIS — Z113 Encounter for screening for infections with a predominantly sexual mode of transmission: Secondary | ICD-10-CM | POA: Insufficient documentation

## 2012-12-09 DIAGNOSIS — N644 Mastodynia: Secondary | ICD-10-CM | POA: Insufficient documentation

## 2012-12-09 DIAGNOSIS — Z803 Family history of malignant neoplasm of breast: Secondary | ICD-10-CM | POA: Insufficient documentation

## 2012-12-09 DIAGNOSIS — Z0000 Encounter for general adult medical examination without abnormal findings: Secondary | ICD-10-CM

## 2012-12-09 HISTORY — DX: Family history of malignant neoplasm of breast: Z80.3

## 2012-12-09 MED ORDER — NORETHIN ACE-ETH ESTRAD-FE 1-20 MG-MCG(24) PO TABS: 1.0000 | ORAL_TABLET | Freq: Every day | ORAL | Status: DC

## 2012-12-09 NOTE — Patient Instructions (Signed)
Great to see you. We will call you with your lab results and pap smear results.  We will also call you with an appointment for an ultrasound of your breasts.

## 2012-12-09 NOTE — Addendum Note (Signed)
Addended by: Dianne Dun on: 12/09/2012 10:28 AM   Modules accepted: Orders

## 2012-12-09 NOTE — Progress Notes (Signed)
Subjective:    Patient ID: Kathryn Bush, female    DOB: 1990/04/12, 22 y.o.   MRN: [id number]  HPI  22 yo GO here for Routine GYN exam and to discuss breast tenderness and contraception.  Has had pap smear in past but she is not sure when. + family h/o breast CA- mom was 39 at diagnosis.  No known FH of uterine, cervical or ovarian CA.  Has had some breast tenderness that was very severe last month prior to her period. No nipple discharge or breast masses.  She is interested in OCPs- periods are heavy and using condoms for birth control. Has never been on OCP in past.  Periods are regular.  Patient Active Problem List   Diagnosis Date Noted  . Family history of breast cancer in mother 12/09/2012  . Screening for STD (sexually transmitted disease) 12/09/2012  . Breast pain in female 12/09/2012  . Other general counseling and advice for contraceptive management 12/09/2012  . Encounter for routine gynecological examination 12/09/2012  . Routine general medical examination at a health care facility 11/24/2012  . Asthma 08/14/2011  . History of PID 06/09/2011   Past Medical History  Diagnosis Date  . Asthma   . PID (acute pelvic inflammatory disease)    No past surgical history on file. History  Substance Use Topics  . Smoking status: Current Some Day Smoker    Types: Cigarettes    Start date: 09/25/2011  . Smokeless tobacco: Not on file     Comment: Occasional  . Alcohol Use: Yes     Comment: OCCASIONAL   Family History  Problem Relation Age of Onset  . Cancer Mother 77    breast   No Known Allergies Current Outpatient Prescriptions on File Prior to Visit  Medication Sig Dispense Refill  . SYMBICORT 160-4.5 MCG/ACT inhaler Inhale 2 puffs into the  lungs two times daily  1 Inhaler  5   No current facility-administered medications on file prior to visit.   The PMH, PSH, Social History, Family History, Medications, and allergies have been reviewed in [facility], and have  been updated if relevant.   Review of Systems No dysuria, vaginal discharge or genital lesions.    Objective:   Physical Exam BP 122/82  Pulse 83  Temp(Src) 98.4 F (36.9 C) (Oral)  Ht 5' 2.5" (1.588 m)  Wt 158 lb (71.668 kg)  BMI 28.42 kg/m2  SpO2 98%  LMP 12/02/2012  General:  Well-developed,well-nourished,in no acute distress; alert,appropriate and cooperative throughout examination Head:  normocephalic and atraumatic.   Mouth:  good dentition.   Neck:  No deformities, masses, or tenderness noted. Breasts:  No mass, nodules, thickening, tenderness, bulging, retraction, inflamation, nipple discharge or skin changes noted.   Abdomen:  Bowel sounds positive,abdomen soft and non-tender without masses, organomegaly or hernias noted. Rectal:  no external abnormalities.   Genitalia:  Pelvic Exam:        External: normal female genitalia without lesions or masses        Vagina: normal without lesions or masses        Cervix: normal without lesions or masses        Adnexa: normal bimanual exam without masses or fullness        Uterus: normal by palpation        Pap smear: performed Psych:  Cognition and judgment appear intact. Alert and cooperative with normal attention span and concentration. No apparent delusions, illusions, hallucinations    Assessment & Plan:  1. Routine general medical examination at a health care facility Reviewed preventive care protocols, scheduled due services, and updated immunizations Discussed nutrition, exercise, diet, and healthy lifestyle.   2. Family history of breast cancer in mother With breast pain. Will order breast US for further evaluation.  Likely due to hormonal fluctuations during period. - US Breast Bilateral; Future  3. Screening for STD (sexually transmitted disease) Discussed sexual activity, pregnancy risk, and STD risk.    - HIV Antibody - RPR - Cytology - PAP  4. Breast pain in female See #2. - US Breast Bilateral;  Future  5. Other general counseling and advice for contraceptive management Discussed tx options and increased risk given mother's h/o breast CA. She would like to try Loestrin- aware of risks. Rx sent.  6. Encounter for routine gynecological examination  - Cytology - PAP

## 2012-12-10 LAB — RPR

## 2013-03-21 ENCOUNTER — Ambulatory Visit (INDEPENDENT_AMBULATORY_CARE_PROVIDER_SITE_OTHER): Payer: 59 | Admitting: Family Medicine

## 2013-03-21 ENCOUNTER — Encounter: Payer: Self-pay | Admitting: Family Medicine

## 2013-03-21 VITALS — BP 116/60 | HR 69 | Temp 98.2°F | Ht 62.0 in | Wt 160.0 lb

## 2013-03-21 DIAGNOSIS — R112 Nausea with vomiting, unspecified: Secondary | ICD-10-CM

## 2013-03-21 HISTORY — DX: Nausea with vomiting, unspecified: R11.2

## 2013-03-21 LAB — POCT URINALYSIS DIPSTICK
Bilirubin, UA: NEGATIVE
Glucose, UA: NEGATIVE
Ketones, UA: NEGATIVE
Nitrite, UA: NEGATIVE
PH UA: 6
UROBILINOGEN UA: 0.2

## 2013-03-21 LAB — CBC WITH DIFFERENTIAL/PLATELET
BASOS ABS: 0 10*3/uL (ref 0.0–0.1)
Basophils Relative: 0.4 % (ref 0.0–3.0)
EOS ABS: 0.7 10*3/uL (ref 0.0–0.7)
Eosinophils Relative: 5.8 % — ABNORMAL HIGH (ref 0.0–5.0)
HEMATOCRIT: 38.9 % (ref 36.0–46.0)
Hemoglobin: 13 g/dL (ref 12.0–15.0)
LYMPHS ABS: 3.1 10*3/uL (ref 0.7–4.0)
Lymphocytes Relative: 28 % (ref 12.0–46.0)
MCHC: 33.4 g/dL (ref 30.0–36.0)
MCV: 92.7 fl (ref 78.0–100.0)
Monocytes Absolute: 1 10*3/uL (ref 0.1–1.0)
Monocytes Relative: 8.7 % (ref 3.0–12.0)
Neutro Abs: 6.4 10*3/uL (ref 1.4–7.7)
Neutrophils Relative %: 57.1 % (ref 43.0–77.0)
Platelets: 225 10*3/uL (ref 150.0–400.0)
RBC: 4.2 Mil/uL (ref 3.87–5.11)
RDW: 13.7 % (ref 11.5–14.6)
WBC: 11.2 10*3/uL — ABNORMAL HIGH (ref 4.5–10.5)

## 2013-03-21 LAB — POCT URINE PREGNANCY: Preg Test, Ur: NEGATIVE

## 2013-03-21 LAB — COMPREHENSIVE METABOLIC PANEL
ALT: 19 U/L (ref 0–35)
AST: 20 U/L (ref 0–37)
Albumin: 3.9 g/dL (ref 3.5–5.2)
Alkaline Phosphatase: 50 U/L (ref 39–117)
BILIRUBIN TOTAL: 0.7 mg/dL (ref 0.3–1.2)
BUN: 10 mg/dL (ref 6–23)
CO2: 26 meq/L (ref 19–32)
Calcium: 9.3 mg/dL (ref 8.4–10.5)
Chloride: 104 mEq/L (ref 96–112)
Creatinine, Ser: 0.8 mg/dL (ref 0.4–1.2)
GFR: 94.49 mL/min (ref >60.00)
Glucose, Bld: 97 mg/dL (ref 70–99)
POTASSIUM: 3.6 meq/L (ref 3.5–5.1)
SODIUM: 137 meq/L (ref 135–145)
Total Protein: 6.5 g/dL (ref 6.0–8.3)

## 2013-03-21 LAB — HCG, QUANTITATIVE, PREGNANCY: hCG, Beta Chain, Quant, S: 0.25 m[IU]/mL

## 2013-03-21 LAB — H. PYLORI ANTIBODY, IGG: H PYLORI IGG: NEGATIVE

## 2013-03-21 LAB — LIPASE: Lipase: 19 U/L (ref 11.0–59.0)

## 2013-03-21 MED ORDER — ONDANSETRON HCL 4 MG PO TABS: 4.0000 mg | ORAL_TABLET | Freq: Three times a day (TID) | ORAL | Status: DC | PRN

## 2013-03-21 NOTE — Progress Notes (Signed)
Pre-visit discussion using our clinic review tool. No additional management support is needed unless otherwise documented below in the visit note.  

## 2013-03-21 NOTE — Assessment & Plan Note (Signed)
U preg neg. UA small LE- send for culture. No rebound or guarding on exam- reassuring, benign exam. No classic for gastroenteritis. Will check labs today for further work up. Zofran as needed for nausea. Prilosec at bedtime.

## 2013-03-21 NOTE — Patient Instructions (Signed)
Good to see you. Please take Zofran as directed for nausea.  You can also take Prilosec OTC for reflux- take it before bed, daily. Please don't take for more than 2 weeks without letting me know.  I will call you with your lab and urine results.

## 2013-03-21 NOTE — Progress Notes (Signed)
   Subjective:    Patient ID: Kathryn DresserJessica Bush, female    DOB: 10/03/1990, 23 y.o.   MRN: [id number]  HPI  23 yo female here for 1 week of intermittent nausea and vomiting. No fevers, no diarrhea.  She has had "all over" abdominal pain. She is able to hold down fluids and some solids- drinking mostly smoothies.  She is sexually active and did have unprotected sex.  LMP- 02/21/2013.  Upon questioning, she has noted some breast tenderness.  Patient Active Problem List   Diagnosis Date Noted  . Nausea with vomiting 03/21/2013  . Family history of breast cancer in mother 12/09/2012  . Screening for STD (sexually transmitted disease) 12/09/2012  . Breast pain in female 12/09/2012  . Other general counseling and advice for contraceptive management 12/09/2012  . Encounter for routine gynecological examination 12/09/2012  . Routine general medical examination at a health care facility 11/24/2012  . Asthma 08/14/2011  . History of PID 06/09/2011   Past Medical History  Diagnosis Date  . Asthma   . PID (acute pelvic inflammatory disease)    No past surgical history on file. History  Substance Use Topics  . Smoking status: Current Some Day Smoker    Types: Cigarettes    Start date: 09/25/2011  . Smokeless tobacco: Not on file     Comment: Occasional  . Alcohol Use: Yes     Comment: OCCASIONAL   Family History  Problem Relation Age of Onset  . Cancer Mother [id number]    breast   No Known Allergies Current Outpatient Prescriptions on File Prior to Visit  Medication Sig Dispense Refill  . SYMBICORT 160-4.5 MCG/ACT inhaler Inhale 2 puffs into the  lungs two times daily  1 Inhaler  5   No current facility-administered medications on file prior to visit.   The PMH, PSH, Social History, Family History, Medications, and allergies have [facility], and have been updated if relevant.    Review of Systems See HPI Denies black stool No Blood in her stool Pain and nausea not  worsened or improved by food.    Objective:   Physical Exam  Constitutional: She appears well-developed and well-nourished. No distress.  HENT:  Head: Normocephalic and atraumatic.  Moist mucous membranes  Abdominal: Soft. Normal appearance and bowel sounds are normal. She exhibits no distension and no fluid wave. There is no hepatosplenomegaly. There is no tenderness. There is no rebound and no CVA tenderness.  Psychiatric: She has a normal mood and affect. Her speech is normal and behavior is normal. Judgment and thought content normal. Cognition and memory are normal.   BP 116/60  Pulse 69  Temp(Src) 98.2 F (36.8 C) (Oral)  Ht 5\' 2"  (1.575 m)  Wt 160 lb (72.576 kg)  BMI 29.26 kg/m2  SpO2 98%  LMP 02/21/2013        Assessment & Plan:

## 2013-03-23 ENCOUNTER — Encounter: Payer: Self-pay | Admitting: Family Medicine

## 2013-03-23 ENCOUNTER — Other Ambulatory Visit: Payer: Self-pay | Admitting: Family Medicine

## 2013-03-23 DIAGNOSIS — R8279 Other abnormal findings on microbiological examination of urine: Secondary | ICD-10-CM

## 2013-03-23 LAB — URINE CULTURE

## 2013-03-23 MED ORDER — CIPROFLOXACIN HCL 500 MG PO TABS: 500.0000 mg | ORAL_TABLET | Freq: Two times a day (BID) | ORAL | Status: DC

## 2013-03-25 ENCOUNTER — Encounter: Payer: Self-pay | Admitting: *Deleted

## 2013-03-29 ENCOUNTER — Telehealth: Payer: Self-pay | Admitting: Family Medicine

## 2013-03-29 NOTE — Telephone Encounter (Signed)
Relevant patient education assigned to patient using Emmi. ° °

## 2013-04-19 ENCOUNTER — Other Ambulatory Visit: Payer: Self-pay | Admitting: *Deleted

## 2013-04-19 MED ORDER — ALBUTEROL SULFATE (2.5 MG/3ML) 0.083% IN NEBU: 2.5000 mg | INHALATION_SOLUTION | Freq: Four times a day (QID) | RESPIRATORY_TRACT | Status: DC | PRN

## 2013-04-19 NOTE — Telephone Encounter (Signed)
rx sent to pharmacy by e-script  

## 2013-04-19 NOTE — Telephone Encounter (Signed)
Faxed request from [facility] asking for refill of Albuterol neb solution, not on active med list, please advise if ok to fill?

## 2013-04-19 NOTE — Telephone Encounter (Signed)
Yes ok to fill

## 2013-04-25 MED ORDER — ALBUTEROL SULFATE (2.5 MG/3ML) 0.083% IN NEBU: 2.5000 mg | INHALATION_SOLUTION | Freq: Four times a day (QID) | RESPIRATORY_TRACT | Status: DC | PRN

## 2013-04-25 NOTE — Telephone Encounter (Signed)
Renee with [facility] left v/m; pts mother request refill of albuterol neb solution for 6 boxes instead of one box; Renee request cb.

## 2013-04-25 NOTE — Addendum Note (Signed)
Addended by: Desmond DikeKNIGHT, Kendall Justo H on: 04/25/2013 11:22 AM   Modules accepted: Orders

## 2013-11-16 ENCOUNTER — Other Ambulatory Visit: Payer: Self-pay | Admitting: Family Medicine

## 2014-01-15 ENCOUNTER — Other Ambulatory Visit: Payer: Self-pay | Admitting: Family Medicine

## 2014-02-24 NOTE — L&D Delivery Note (Signed)
Pushing with UC's and prepared for birth in the lithotomy position. Vtx presented with CAN x 1 reduced, Ant and post shoulder del and body at 0001. To mom's abd, +succulent cry, no sx required. Baby in the ROA pos. CCx2 and cut per friends. 3 VC noted. SDOP intact. FF and lochia mod. Rt and Lt periurethral x 1 suture with 3-0 CH on CT. Epidural for repair. VSS. Hemostasis achieved. Pt is bonding with infant and breastfeeding. Needle ct correct. Hemostasis achieved. Skin to skin bonding. No sponges needed.

## 2014-04-11 DIAGNOSIS — J4520 Mild intermittent asthma, uncomplicated: Secondary | ICD-10-CM | POA: Insufficient documentation

## 2014-04-11 LAB — OB RESULTS CONSOLE GC/CHLAMYDIA
Chlamydia: NEGATIVE
GC PROBE AMP, GENITAL: NEGATIVE

## 2014-04-11 LAB — OB RESULTS CONSOLE HEPATITIS B SURFACE ANTIGEN: HEP B S AG: NEGATIVE

## 2014-04-11 LAB — OB RESULTS CONSOLE VARICELLA ZOSTER ANTIBODY, IGG: Varicella: IMMUNE

## 2014-04-11 LAB — OB RESULTS CONSOLE HIV ANTIBODY (ROUTINE TESTING): HIV: NONREACTIVE

## 2014-04-11 LAB — OB RESULTS CONSOLE RUBELLA ANTIBODY, IGM: RUBELLA: IMMUNE

## 2014-04-11 LAB — OB RESULTS CONSOLE RPR: RPR: NONREACTIVE

## 2014-04-15 ENCOUNTER — Other Ambulatory Visit: Payer: Self-pay | Admitting: Family Medicine

## 2014-05-09 DIAGNOSIS — O210 Mild hyperemesis gravidarum: Secondary | ICD-10-CM

## 2014-05-09 HISTORY — DX: Mild hyperemesis gravidarum: O21.0

## 2014-08-29 DIAGNOSIS — O09899 Supervision of other high risk pregnancies, unspecified trimester: Secondary | ICD-10-CM | POA: Insufficient documentation

## 2014-08-29 DIAGNOSIS — Z9119 Patient's noncompliance with other medical treatment and regimen: Secondary | ICD-10-CM

## 2014-09-26 LAB — OB RESULTS CONSOLE GBS: STREP GROUP B AG: NEGATIVE

## 2014-10-16 ENCOUNTER — Inpatient Hospital Stay
Admission: EM | Admit: 2014-10-16 | Discharge: 2014-10-19 | DRG: 982 | Disposition: A | Discharge status: Home / Self Care | Payer: Medicaid Other | Attending: Obstetrics and Gynecology | Admitting: Obstetrics and Gynecology

## 2014-10-16 ENCOUNTER — Inpatient Hospital Stay: Payer: Medicaid Other | Admitting: Anesthesiology

## 2014-10-16 DIAGNOSIS — Z3A39 39 weeks gestation of pregnancy: Secondary | ICD-10-CM | POA: Diagnosis present

## 2014-10-16 DIAGNOSIS — O9952 Diseases of the respiratory system complicating childbirth: Secondary | ICD-10-CM | POA: Diagnosis present

## 2014-10-16 DIAGNOSIS — O210 Mild hyperemesis gravidarum: Secondary | ICD-10-CM | POA: Diagnosis present

## 2014-10-16 DIAGNOSIS — Z809 Family history of malignant neoplasm, unspecified: Secondary | ICD-10-CM | POA: Diagnosis not present

## 2014-10-16 DIAGNOSIS — F1290 Cannabis use, unspecified, uncomplicated: Secondary | ICD-10-CM | POA: Diagnosis present

## 2014-10-16 DIAGNOSIS — O9989 Other specified diseases and conditions complicating pregnancy, childbirth and the puerperium: Secondary | ICD-10-CM | POA: Diagnosis present

## 2014-10-16 DIAGNOSIS — N739 Female pelvic inflammatory disease, unspecified: Secondary | ICD-10-CM | POA: Diagnosis present

## 2014-10-16 DIAGNOSIS — J45909 Unspecified asthma, uncomplicated: Secondary | ICD-10-CM | POA: Diagnosis present

## 2014-10-16 DIAGNOSIS — O99324 Drug use complicating childbirth: Secondary | ICD-10-CM | POA: Diagnosis present

## 2014-10-16 DIAGNOSIS — O99334 Smoking (tobacco) complicating childbirth: Secondary | ICD-10-CM | POA: Diagnosis present

## 2014-10-16 DIAGNOSIS — Z825 Family history of asthma and other chronic lower respiratory diseases: Secondary | ICD-10-CM | POA: Diagnosis not present

## 2014-10-16 DIAGNOSIS — F17210 Nicotine dependence, cigarettes, uncomplicated: Secondary | ICD-10-CM | POA: Diagnosis present

## 2014-10-16 LAB — CBC
HEMATOCRIT: 34.7 % — AB (ref 35.0–47.0)
HEMOGLOBIN: 11.4 g/dL — AB (ref 12.0–16.0)
MCH: 28.2 pg (ref 26.0–34.0)
MCHC: 33 g/dL (ref 32.0–36.0)
MCV: 85.5 fL (ref 80.0–100.0)
Platelets: 216 10*3/uL (ref 150–440)
RBC: 4.05 MIL/uL (ref 3.80–5.20)
RDW: 15.7 % — ABNORMAL HIGH (ref 11.5–14.5)
WBC: 14.1 10*3/uL — AB (ref 3.6–11.0)

## 2014-10-16 LAB — URINE DRUG SCREEN, QUALITATIVE (ARMC ONLY)
Amphetamines, Ur Screen: NEGATIVE — AB
BARBITURATES, UR SCREEN: NEGATIVE — AB
Benzodiazepine, Ur Scrn: NEGATIVE — AB
CANNABINOID 50 NG, UR ~~LOC~~: NEGATIVE — AB
COCAINE METABOLITE, UR ~~LOC~~: NEGATIVE — AB
MDMA (Ecstasy)Ur Screen: NEGATIVE — AB
Methadone Scn, Ur: NEGATIVE — AB
Opiate, Ur Screen: NEGATIVE — AB
PHENCYCLIDINE (PCP) UR S: NEGATIVE — AB
TRICYCLIC, UR SCREEN: NEGATIVE — AB

## 2014-10-16 LAB — TYPE AND SCREEN
ABO/RH(D): O POS
ANTIBODY SCREEN: NEGATIVE

## 2014-10-16 LAB — CHLAMYDIA/NGC RT PCR (ARMC ONLY)
Chlamydia Tr: NOT DETECTED
N gonorrhoeae: NOT DETECTED

## 2014-10-16 LAB — ABO/RH: ABO/RH(D): O POS

## 2014-10-16 MED ORDER — SODIUM CHLORIDE 0.9 % IJ SOLN
INTRAMUSCULAR | Status: AC
  Filled 2014-10-16: qty 10

## 2014-10-16 MED ORDER — LACTATED RINGERS IV SOLN: 500.0000 mL | INTRAVENOUS | Status: DC | PRN

## 2014-10-16 MED ORDER — LIDOCAINE HCL (PF) 1 % IJ SOLN
30.0000 mL | INTRAMUSCULAR | Status: DC | PRN
  Filled 2014-10-16: qty 30

## 2014-10-16 MED ORDER — LACTATED RINGERS IV SOLN
INTRAVENOUS | Status: DC
  Administered 2014-10-16 (×3): via INTRAVENOUS

## 2014-10-16 MED ORDER — BUTORPHANOL TARTRATE 1 MG/ML IJ SOLN
1.0000 mg | INTRAMUSCULAR | Status: DC | PRN
  Administered 2014-10-16: 1 mg via INTRAVENOUS
  Filled 2014-10-16: qty 1

## 2014-10-16 MED ORDER — FENTANYL 2.5 MCG/ML W/ROPIVACAINE 0.2% IN NS 100 ML EPIDURAL INFUSION (ARMC-ANES)
10.0000 mL/h | EPIDURAL | Status: DC
  Administered 2014-10-16: 10 mL/h via EPIDURAL

## 2014-10-16 MED ORDER — EPHEDRINE SULFATE 50 MG/ML IJ SOLN
INTRAMUSCULAR | Status: AC
  Filled 2014-10-16: qty 1

## 2014-10-16 MED ORDER — OXYTOCIN 40 UNITS IN LACTATED RINGERS INFUSION - SIMPLE MED
62.5000 mL/h | INTRAVENOUS | Status: DC
  Filled 2014-10-16: qty 1000

## 2014-10-16 MED ORDER — ACETAMINOPHEN 325 MG PO TABS: 650.0000 mg | ORAL_TABLET | ORAL | Status: DC | PRN

## 2014-10-16 MED ORDER — ROPIVACAINE HCL 2 MG/ML IJ SOLN
INTRAMUSCULAR | Status: DC | PRN
  Administered 2014-10-16 (×2): 5 mL via EPIDURAL

## 2014-10-16 MED ORDER — CITRIC ACID-SODIUM CITRATE 334-500 MG/5ML PO SOLN: 30.0000 mL | ORAL | Status: DC | PRN

## 2014-10-16 MED ORDER — OXYTOCIN BOLUS FROM INFUSION: 500.0000 mL | INTRAVENOUS | Status: DC

## 2014-10-16 MED ORDER — OXYTOCIN 40 UNITS IN LACTATED RINGERS INFUSION - SIMPLE MED
62.5000 mL/h | INTRAVENOUS | Status: DC
  Administered 2014-10-16: 2 mL/h via INTRAVENOUS

## 2014-10-16 MED ORDER — ONDANSETRON HCL 4 MG/2ML IJ SOLN
4.0000 mg | Freq: Four times a day (QID) | INTRAMUSCULAR | Status: DC | PRN
  Administered 2014-10-16: 4 mg via INTRAVENOUS
  Filled 2014-10-16: qty 2

## 2014-10-16 MED ORDER — EPHEDRINE 5 MG/ML INJ
10.0000 mg | INTRAVENOUS | Status: AC | PRN
  Administered 2014-10-16 (×2): 10 mg via INTRAVENOUS

## 2014-10-16 MED ORDER — FENTANYL 2.5 MCG/ML W/ROPIVACAINE 0.2% IN NS 100 ML EPIDURAL INFUSION (ARMC-ANES)
EPIDURAL | Status: AC
  Administered 2014-10-16: 10 mL/h via EPIDURAL
  Filled 2014-10-16: qty 100

## 2014-10-16 NOTE — Progress Notes (Signed)
Patient ID: Kathryn Bush, female   DOB: 1990-11-06, 24 y.o.   MRN: [id number]  Pushing well with UC's. Turned to Lt side to see if baby will rotate. Pt is pushing well. FHR: 130, 1 decel to 90 x 2 mins with slow recover and some accels and VSS. Pt BP is 127/68 as an erroneous BP show 200 systolic but, was not accurate.

## 2014-10-16 NOTE — Anesthesia Preprocedure Evaluation (Addendum)
Anesthesia Evaluation  Patient identified by MRN, date of birth, ID band Patient awake    Reviewed: Allergy & Precautions, NPO status , Patient's Chart, lab work & pertinent test results  Airway Mallampati: II  TM Distance: >3 FB Neck ROM: Full    Dental no notable dental hx. (+) Teeth Intact   Pulmonary asthma (last inhaler yesterday) , Current Smoker,  breath sounds clear to auscultation  Pulmonary exam normal       Cardiovascular negative cardio ROS Normal cardiovascular exam    Neuro/Psych    GI/Hepatic negative GI ROS, Neg liver ROS,   Endo/Other  negative endocrine ROS  Renal/GU negative Renal ROS  negative genitourinary   Musculoskeletal negative musculoskeletal ROS (+)   Abdominal Normal abdominal exam  (+)   Peds negative pediatric ROS (+)  Hematology negative hematology ROS (+)   Anesthesia Other Findings   Reproductive/Obstetrics (+) Pregnancy                            Anesthesia Physical Anesthesia Plan  ASA: II  Anesthesia Plan: Epidural   Post-op Pain Management:    Induction:   Airway Management Planned: Natural Airway  Additional Equipment:   Intra-op Plan:   Post-operative Plan:   Informed Consent: I have reviewed the patients History and Physical, chart, labs and discussed the procedure including the risks, benefits and alternatives for the proposed anesthesia with the patient or authorized representative who has indicated his/her understanding and acceptance.   Dental advisory given  Plan Discussed with: Surgeon  Anesthesia Plan Comments:         Anesthesia Quick Evaluation

## 2014-10-16 NOTE — H&P (Signed)
Kathryn Bush is a 24 y.o. female presenting for SROM at 0600am for clear fluid. Pt had gotten up to the bathroom and felt the ROM. Pt had PNC at [facility] significant for Hyperemesis gravidarum during the pregnancy 1st trimester. Pt had low risk pregnancy after the 1st trimester.  Maternal Medical History:  Reason for admission: Rupture of membranes.   Contractions: Onset was yesterday.   Frequency: regular.   Duration is approximately 55 seconds.   Perceived severity is mild.    Fetal activity: Perceived fetal activity is normal.    Prenatal complications: No bleeding, cholelithiasis, HIV, PIH, infection, IUGR, nephrolithiasis, oligohydramnios, placental abnormality, polyhydramnios, pre-eclampsia, preterm labor, substance abuse, thrombocytopenia or thrombophilia.     OB History    No data available     Past Medical History  Diagnosis Date  . Asthma   . PID (acute pelvic inflammatory disease)    No past surgical history on file. Family History: family history includes Cancer (age of onset: 59) in her mother. Social History:  reports that she has been smoking Cigarettes.  She started smoking about 3 years ago. She does not have any smokeless tobacco history on file. She reports that she drinks alcohol. She reports that she uses illicit drugs (Marijuana) about 7 times per week.   Prenatal Transfer Tool  Maternal Diabetes: No Genetic Screening: Normal Maternal Ultrasounds/Referrals: Normal Fetal Ultrasounds or other Referrals:  None Maternal Substance Abuse:  Yes:  Type: Marijuana Significant Maternal Medications:  None Significant Maternal Lab Results:  None Other Comments:  None  ROS    Review of Systems - Benign x 9 Exam  24 yo White female in NAD. Heart: S1S2, RRR, no M/R/G. Lungs: CTA bilat, No W/R/R. Abd: EFW 7#8, Gravid. Cx: 2/70%/  Vtx-3 Extrems: DTR's 1+/0  Physical Exam  Constitutional: She is oriented to person, place, and time. She appears well-developed.   HENT:  Head: Normocephalic.  Eyes: Pupils are equal, round, and reactive to light.  Neck: Normal range of motion.  Cardiovascular: Normal rate.   Respiratory: Effort normal and breath sounds normal.  Musculoskeletal: She exhibits no edema or tenderness.  Neurological: She is alert and oriented to person, place, and time. She has normal reflexes.  Skin: Skin is warm.  Psychiatric: She has a normal mood and affect.  BP 139/84 P100 R24   FHR: 140, +accels, 1 decel to 90 noted with recovery to 140. Mod variability.   Prenatal labs: ABO, Rh:   O pos Antibody:   Neg Rubella:  immune RPR:    Non-reactive HBsAg:   Neg HIV:   Neg GBS:   Neg per [facility] per phone  Assessment/Plan: A: 1. IUP at 38 6/7 weeks 2. GBS neg 3. Confirmed SROM visualized by RN  P: Admit to Birthplace 2. Continue to monitor UC/FHT's 3. Monitor VSSharee Pimple 10/16/2014, 8:53 AM

## 2014-10-16 NOTE — Progress Notes (Signed)
Kathryn Bush is a 24 y.o. G1P0 at [redacted]w[redacted]d by LMP admitted for active labor  Subjective: Pt is hurting and wants meds and an epidural.   Objective: BP 122/83 mmHg  Pulse 93  Temp(Src) 97.9 F (36.6 C) (Oral)  Resp 16  Ht  (1.575 m)  Wt 78.472 kg (173 lb)  BMI 31.63 kg/m2  LMP 01/15/2014   Total I/O In: 354.2 [I.V.:354.2] Out: -   FHT:  FHR: 145, no decels, +accels, cat I bpm, variability: moderate,  accelerations:  Present,  decelerations:  Absent UC:   regular, every 2 minutes, mod strong to palpation SVE:   Dilation: 3 Effacement (%): 100 Station: -1 Exam by:: Beatriz Stallion, CNM  Labs: Lab Results  Component Value Date   WBC 14.1* 10/16/2014   HGB 11.4* 10/16/2014   HCT 34.7* 10/16/2014   MCV 85.5 10/16/2014   PLT 216 10/16/2014    Assessment / Plan: Spontaneous labor, progressing normally  Labor: Progressing normally Preeclampsia:  none Fetal Wellbeing:  Category I Pain Control:  stadol planned I/D:  n/a Anticipated MOD:  NSVD  Sharee Pimple 10/16/2014, 12:36 PM

## 2014-10-16 NOTE — Progress Notes (Signed)
Kathryn Bush is a 24 y.o. G1P0 at [redacted]w[redacted]d by LMP admitted for rupture of membranes  Subjective: Vomiting due to nausea  Objective: BP 105/55 mmHg  Pulse 102  Temp(Src) 98.3 F (36.8 C) (Oral)  Resp 16  Ht  (1.575 m)  Wt 78.472 kg (173 lb)  BMI 31.63 kg/m2  LMP 01/15/2014 I/O last 3 completed shifts: In: 1833.3 [I.V.:1833.3] Out: 1500 [Urine:1500]    FHT:  FHR: 145 bpm, variability: marked,  accelerations:  Present,  decelerations:  Present mod variabil\ity, 1 decel noted with vomiting UC:   regular, every  minutes SVE:   Dilation: 8 Effacement (%): 100 Station: -1 Exam by:: Ermalene Searing, RNC  Labs: Lab Results  Component Value Date   WBC 14.1* 10/16/2014   HGB 11.4* 10/16/2014   HCT 34.7* 10/16/2014   MCV 85.5 10/16/2014   PLT 216 10/16/2014    Assessment / Plan: Spontaneous labor, progressing normally  Labor: Progressing normally and cx is still 7 cms Preeclampsia:  none Fetal Wellbeing:  Category I Pain Control:  Epidural I/D:  n/a Anticipated MOD:  NSVD  Add Pitocin per protocol.   Milon Score W 10/16/2014, 8:01 PM

## 2014-10-16 NOTE — OB Triage Note (Signed)
Urine protein dip- negative

## 2014-10-16 NOTE — Progress Notes (Signed)
Kathryn Bush is a 24 y.o. G1P0 at [redacted]w[redacted]d by LMP admitted for active labor  Subjective: Feeling more uncomfortable with UC's  Objective: BP 122/83 mmHg  Pulse 93  Temp(Src) 97.9 F (36.6 C) (Oral)  Resp 16  Ht  (1.575 m)  Wt 78.472 kg (173 lb)  BMI 31.63 kg/m2  LMP 01/15/2014      FHT:  140, Cat 1, +accels, 1 decel that is maternal noted to 90's.  UC:   regular, every 2-4 minutes  Labs: Lab Results  Component Value Date   WBC 14.1* 10/16/2014   HGB 11.4* 10/16/2014   HCT 34.7* 10/16/2014   MCV 85.5 10/16/2014   PLT 216 10/16/2014    Assessment / Plan: Spontaneous labor, progressing normally  Labor: Progressing normally Preeclampsia:  none Fetal Wellbeing:  Category I Pain Control:  Labor support without medications I/D:  n/a Anticipated MOD:  NSVD  Sharee Pimple 10/16/2014, 11:10 AM

## 2014-10-16 NOTE — Anesthesia Procedure Notes (Signed)
Epidural Patient location during procedure: OB Start time: 10/16/2014 2:17 PM End time: 10/16/2014 2:38 PM  Staffing Performed by: anesthesiologist   Preanesthetic Checklist Completed: patient identified, site marked, surgical consent, pre-op evaluation, timeout performed, IV checked, risks and benefits discussed and monitors and equipment checked  Epidural Patient position: sitting Prep: Betadine Patient monitoring: heart rate, continuous pulse ox and blood pressure Approach: midline Location: L4-L5 Injection technique: LOR saline  Needle:  Needle type: Tuohy  Needle gauge: 18 G Needle length: 9 cm and 9 Catheter type: closed end flexible Catheter size: 20 Guage Test dose: negative and 1.5% lidocaine with Epi 1:200 K  Assessment Events: blood not aspirated, injection not painful, no injection resistance, negative IV test and no paresthesia  Additional Notes   Patient tolerated the insertion well without complications.Reason for block:procedure for pain

## 2014-10-16 NOTE — ED Notes (Signed)
Pt was pre-registered and was sent straight to L&D per protocol.

## 2014-10-16 NOTE — Progress Notes (Signed)
Kathryn Bush is a 24 y.o. G1P0 at [redacted]w[redacted]d by LMP admitted for rupture of membranes  Subjective: Feels good pushing  Objective: BP 114/61 mmHg  Pulse 94  Temp(Src) 98.3 F (36.8 C) (Oral)  Resp 16  Ht  (1.575 m)  Wt 78.472 kg (173 lb)  BMI 31.63 kg/m2  LMP 01/15/2014 I/O last 3 completed shifts: In: 1833.3 [I.V.:1833.3] Out: 1500 [Urine:1500]    FHT:  FHR: 140, +accels, 1 variability to 90 with recovery, Cat II bpm, variability: moderate,  accelerations:  Present,  decelerations:  Present Mod vari UC:   regular, every 2  minutes SVE:   Dilation: 10 Effacement (%): 100 Station: +1 Exam by:: CJ. CNM  Labs: Lab Results  Component Value Date   WBC 14.1* 10/16/2014   HGB 11.4* 10/16/2014   HCT 34.7* 10/16/2014   MCV 85.5 10/16/2014   PLT 216 10/16/2014    Assessment / Plan: Augmentation of labor, progressing well  Labor: Progressing on Pitocin, will continue to increase then AROM and pushing well with UC's on Pitocin at 4 mun/min Preeclampsia:  none Fetal Wellbeing:  Category I Pain Control:  Epidural I/D:  n/a Anticipated MOD:  NSVD  Sharee Pimple 10/16/2014, 11:14 PM

## 2014-10-17 LAB — CBC
HEMATOCRIT: 31.1 % — AB (ref 35.0–47.0)
HEMOGLOBIN: 10.4 g/dL — AB (ref 12.0–16.0)
MCH: 28.3 pg (ref 26.0–34.0)
MCHC: 33.4 g/dL (ref 32.0–36.0)
MCV: 84.8 fL (ref 80.0–100.0)
Platelets: 186 10*3/uL (ref 150–440)
RBC: 3.67 MIL/uL — ABNORMAL LOW (ref 3.80–5.20)
RDW: 15.9 % — ABNORMAL HIGH (ref 11.5–14.5)
WBC: 23.6 10*3/uL — ABNORMAL HIGH (ref 3.6–11.0)

## 2014-10-17 LAB — RPR: RPR Ser Ql: NONREACTIVE

## 2014-10-17 MED ORDER — SODIUM CHLORIDE 0.9 % IJ SOLN: 3.0000 mL | INTRAMUSCULAR | Status: DC | PRN

## 2014-10-17 MED ORDER — TETANUS-DIPHTH-ACELL PERTUSSIS 5-2.5-18.5 LF-MCG/0.5 IM SUSP: 0.5000 mL | Freq: Once | INTRAMUSCULAR | Status: DC

## 2014-10-17 MED ORDER — PRENATAL MULTIVITAMIN CH
1.0000 | ORAL_TABLET | Freq: Every day | ORAL | Status: DC
  Administered 2014-10-17 – 2014-10-19 (×3): 1 via ORAL
  Filled 2014-10-17 (×3): qty 1

## 2014-10-17 MED ORDER — BISACODYL 10 MG RE SUPP: 10.0000 mg | Freq: Every day | RECTAL | Status: DC | PRN

## 2014-10-17 MED ORDER — OXYCODONE-ACETAMINOPHEN 5-325 MG PO TABS
2.0000 | ORAL_TABLET | ORAL | Status: DC | PRN
  Filled 2014-10-17: qty 2

## 2014-10-17 MED ORDER — BENZOCAINE-MENTHOL 20-0.5 % EX AERO
1.0000 "application " | INHALATION_SPRAY | CUTANEOUS | Status: DC | PRN
  Filled 2014-10-17: qty 56

## 2014-10-17 MED ORDER — ZOLPIDEM TARTRATE 5 MG PO TABS: 5.0000 mg | ORAL_TABLET | Freq: Every evening | ORAL | Status: DC | PRN

## 2014-10-17 MED ORDER — DIBUCAINE 1 % RE OINT: 1.0000 "application " | TOPICAL_OINTMENT | RECTAL | Status: DC | PRN

## 2014-10-17 MED ORDER — OXYCODONE-ACETAMINOPHEN 5-325 MG PO TABS
ORAL_TABLET | ORAL | Status: AC
  Administered 2014-10-17: 1 via ORAL
  Filled 2014-10-17: qty 1

## 2014-10-17 MED ORDER — OXYTOCIN 40 UNITS IN LACTATED RINGERS INFUSION - SIMPLE MED: 62.5000 mL/h | INTRAVENOUS | Status: DC | PRN

## 2014-10-17 MED ORDER — IBUPROFEN 600 MG PO TABS
ORAL_TABLET | ORAL | Status: AC
  Administered 2014-10-17: 600 mg via ORAL
  Filled 2014-10-17: qty 1

## 2014-10-17 MED ORDER — ONDANSETRON HCL 4 MG/2ML IJ SOLN: 4.0000 mg | INTRAMUSCULAR | Status: DC | PRN

## 2014-10-17 MED ORDER — OXYCODONE-ACETAMINOPHEN 5-325 MG PO TABS
1.0000 | ORAL_TABLET | ORAL | Status: DC | PRN
  Administered 2014-10-17: 1 via ORAL

## 2014-10-17 MED ORDER — DIPHENHYDRAMINE HCL 25 MG PO CAPS: 25.0000 mg | ORAL_CAPSULE | Freq: Four times a day (QID) | ORAL | Status: DC | PRN

## 2014-10-17 MED ORDER — SENNOSIDES-DOCUSATE SODIUM 8.6-50 MG PO TABS
2.0000 | ORAL_TABLET | ORAL | Status: DC
  Administered 2014-10-17: 2 via ORAL
  Filled 2014-10-17: qty 1
  Filled 2014-10-17: qty 2

## 2014-10-17 MED ORDER — ACETAMINOPHEN 325 MG PO TABS: 650.0000 mg | ORAL_TABLET | ORAL | Status: DC | PRN

## 2014-10-17 MED ORDER — WITCH HAZEL-GLYCERIN EX PADS: 1.0000 "application " | MEDICATED_PAD | CUTANEOUS | Status: DC | PRN

## 2014-10-17 MED ORDER — SODIUM CHLORIDE 0.9 % IV SOLN: 250.0000 mL | INTRAVENOUS | Status: DC | PRN

## 2014-10-17 MED ORDER — LANOLIN HYDROUS EX OINT: TOPICAL_OINTMENT | CUTANEOUS | Status: DC | PRN

## 2014-10-17 MED ORDER — MEASLES, MUMPS & RUBELLA VAC ~~LOC~~ INJ: 0.5000 mL | INJECTION | Freq: Once | SUBCUTANEOUS | Status: DC

## 2014-10-17 MED ORDER — ONDANSETRON HCL 4 MG PO TABS: 4.0000 mg | ORAL_TABLET | ORAL | Status: DC | PRN

## 2014-10-17 MED ORDER — IBUPROFEN 600 MG PO TABS
600.0000 mg | ORAL_TABLET | Freq: Four times a day (QID) | ORAL | Status: DC
  Administered 2014-10-17 – 2014-10-19 (×10): 600 mg via ORAL
  Filled 2014-10-17 (×10): qty 1

## 2014-10-17 MED ORDER — SODIUM CHLORIDE 0.9 % IJ SOLN: 3.0000 mL | Freq: Two times a day (BID) | INTRAMUSCULAR | Status: DC

## 2014-10-17 MED ORDER — FLEET ENEMA 7-19 GM/118ML RE ENEM: 1.0000 | ENEMA | Freq: Every day | RECTAL | Status: DC | PRN

## 2014-10-17 MED ORDER — SIMETHICONE 80 MG PO CHEW: 80.0000 mg | CHEWABLE_TABLET | ORAL | Status: DC | PRN

## 2014-10-17 NOTE — Progress Notes (Signed)
error 

## 2014-10-17 NOTE — Anesthesia Postprocedure Evaluation (Signed)
  Anesthesia Post-op Note  Patient: Kathryn Bush  Procedure(s) Performed: * No procedures listed *  Anesthesia type:Epidural  Patient location: PACU  Post pain: Pain level controlled  Post assessment: Post-op Vital signs reviewed, Patient's Cardiovascular Status Stable, Respiratory Function Stable, Patent Airway and No signs of Nausea or vomiting  Post vital signs: Reviewed and stable  Last Vitals:  Filed Vitals:   10/17/14 0255  BP: 107/72  Pulse: 108  Temp: 36.7 C  Resp: 18    Level of consciousness: awake, alert  and patient cooperative  Complications: No apparent anesthesia complications

## 2014-10-18 MED ORDER — HYDROCODONE-ACETAMINOPHEN 5-325 MG PO TABS
1.0000 | ORAL_TABLET | ORAL | Status: DC | PRN
  Administered 2014-10-18 – 2014-10-19 (×2): 1 via ORAL
  Filled 2014-10-18 (×2): qty 1

## 2014-10-18 NOTE — Progress Notes (Signed)
Post Partum Day today0001 Subjective: cramping ,episiotomy pain  Objective: Blood pressure 114/70, pulse 91, temperature 98.4 F (36.9 C), temperature source Axillary, resp. rate 18, height  (1.575 m), weight 173 lb (78.472 kg), last menstrual period 01/15/2014, SpO2 99 %, unknown if currently breastfeeding.  Physical Exam:  General: alert and cooperative Lochia: appropriate Uterine Fundus: firm Incision: n/a  DVT Evaluation: No evidence of DVT seen on physical exam.   Recent Labs  10/16/14 0932 10/17/14 0513  HGB 11.4* 10.4*  HCT 34.7* 31.1*    Assessment/Plan: Plan for discharge tomorrow   LOS: 2 days   SCHERMERHORN,THOMAS 10/18/2014, 8:34 AM

## 2014-10-19 MED ORDER — PRAMOXINE HCL 1 % RE FOAM: Freq: Three times a day (TID) | RECTAL | Status: DC | PRN

## 2014-10-19 MED ORDER — DOCUSATE SODIUM 100 MG PO CAPS: 100.0000 mg | ORAL_CAPSULE | Freq: Two times a day (BID) | ORAL | Status: DC | PRN

## 2014-10-19 MED ORDER — HYDROCODONE-ACETAMINOPHEN 5-325 MG PO TABS: 1.0000 | ORAL_TABLET | ORAL | Status: DC | PRN

## 2014-10-19 MED ORDER — IBUPROFEN 600 MG PO TABS: 600.0000 mg | ORAL_TABLET | Freq: Four times a day (QID) | ORAL | Status: DC

## 2014-10-19 NOTE — Discharge Summary (Signed)
Obstetric Discharge Summary Reason for Admission: rupture of membranes Prenatal Procedures: ultrasound Intrapartum Procedures: spontaneous vaginal delivery Postpartum Procedures: none Complications-Operative and Postpartum: none HEMOGLOBIN  Date Value Ref Range Status  10/17/2014 10.4* 12.0 - 16.0 g/dL Final   HCT  Date Value Ref Range Status  10/17/2014 31.1* 35.0 - 47.0 % Final    Physical Exam:  General: alert, cooperative and no distress Lochia: appropriate Uterine Fundus: firm DVT Evaluation: No evidence of DVT seen on physical exam.  Discharge Diagnoses: Term Pregnancy-delivered  Discharge Information: Date: 10/19/2014 Activity: pelvic rest Diet: routine Medications: PNV, Ibuprofen and Colace Condition: stable Instructions: refer to practice specific booklet Discharge to: home Follow-up Information    Follow up with Sharee Pimple, CNM In 6 weeks.   Specialty:  Obstetrics and Gynecology   Why:  For postpartum visit   Contact information:   [address] [place] [address] [phone]       Newborn Data: Live born female  Birth Weight: 7 lb 3.3 oz (3270 g) APGAR: 8, 9  Home with mother. Baby under bili lights on maternal day of discharge, and mom is planning to room in overnight.  Kathryn Bush 10/19/2014, 1:07 PM

## 2014-10-19 NOTE — Addendum Note (Signed)
Addendum  created 10/19/14 1610 by Irving Burton, CRNA   Modules edited: Anesthesia Events

## 2014-10-19 NOTE — Discharge Instructions (Signed)
Vaginal Delivery, Care After Refer to this sheet in the next few weeks. These discharge instructions provide you with information on caring for yourself after delivery. Your caregiver may also give you specific instructions. Your treatment has been planned according to the most current medical practices available, but problems sometimes occur. Call your caregiver if you have any problems or questions after you go home. HOME CARE INSTRUCTIONS  Take over-the-counter or prescription medicines only as directed by your caregiver or pharmacist.  Do not drink alcohol, especially if you are breastfeeding or taking medicine to relieve pain.  Do not chew or smoke tobacco.  Do not use illegal drugs.  Continue to use good perineal care. Good perineal care includes:  Wiping your perineum from front to back.  Keeping your perineum clean.  Do not use tampons or douche, or have sexual intercourse for the next 6 weeks.   Showers only, no tub baths for the next 6 weeks.   Wear a well-fitting bra that provides breast support.  Eat healthy foods.  Drink enough fluids to keep your urine clear or pale yellow.  Eat high-fiber foods such as whole grain cereals and breads, brown rice, beans, and fresh fruits and vegetables every day. These foods may help prevent or relieve constipation.  Follow your caregiver's recommendations regarding resumption of activities such as climbing stairs, driving, lifting, exercising, or traveling.  Try to have someone help you with your household activities and your newborn for at least a few days after you leave the hospital.  Rest as much as possible. Try to rest or take a nap when your newborn is sleeping.  Increase your activities gradually.  Keep all of your scheduled postpartum appointments. It is very important to keep your scheduled follow-up appointments. At these appointments, your caregiver will be checking to make sure that you are healing physically and  emotionally. SEEK MEDICAL CARE IF:   You are passing large clots from your vagina. Save any clots to show your caregiver.  You have a foul smelling discharge from your vagina.  You have trouble urinating.  You are urinating frequently.  You have pain when you urinate.  You have a change in your bowel movements.  You have increasing redness, pain, or swelling near your vaginal incision (episiotomy) or vaginal tear.  You have pus draining from your episiotomy or vaginal tear.  Your episiotomy or vaginal tear is separating.  You have painful, hard, or reddened breasts.  You have a severe headache.  You have blurred vision or see spots.  You feel sad or depressed.  You have thoughts of hurting yourself or your newborn.  You have questions about your care, the care of your newborn, or medicines.  You are dizzy or light-headed.  You have a rash.  You have nausea or vomiting.  You were breastfeeding and have not had a menstrual period within 12 weeks after you stopped breastfeeding.  You are not breastfeeding and have not had a menstrual period by the 12th week after delivery.  You have a fever. SEEK IMMEDIATE MEDICAL CARE IF:   You have persistent pain.  You have chest pain.  You have shortness of breath.  You faint.  You have leg pain.  You have stomach pain.  Your vaginal bleeding saturates two or more sanitary pads in 1 hour. MAKE SURE YOU:   Understand these instructions.  Will watch your condition.  Will get help right away if you are not doing well or get worse. This  information is not intended to replace advice given to you by your health care provider. Make sure you discuss any questions you have with your health care provider. ° °

## 2014-10-19 NOTE — Progress Notes (Signed)
Discharge instructions reviewed with patient. Pt v/u of all instructions. Pt to remain in room 334 with infant overnight.  Ruta Hinds, RN  10/19/14 [phone]

## 2015-01-25 ENCOUNTER — Encounter: Payer: Self-pay | Admitting: Internal Medicine

## 2015-01-25 ENCOUNTER — Ambulatory Visit (INDEPENDENT_AMBULATORY_CARE_PROVIDER_SITE_OTHER): Payer: 59 | Admitting: Internal Medicine

## 2015-01-25 VITALS — BP 110/70 | HR 79 | Temp 98.4°F | Wt 155.0 lb

## 2015-01-25 DIAGNOSIS — Z202 Contact with and (suspected) exposure to infections with a predominantly sexual mode of transmission: Secondary | ICD-10-CM

## 2015-01-25 MED ORDER — AZITHROMYCIN 500 MG PO TABS: 1000.0000 mg | ORAL_TABLET | Freq: Once | ORAL | Status: DC

## 2015-01-25 NOTE — Progress Notes (Signed)
Pre visit review using our clinic review tool, if applicable. No additional management support is needed unless otherwise documented below in the visit note. 

## 2015-01-25 NOTE — Progress Notes (Signed)
Subjective:    Patient ID: Kathryn Bush, female    DOB: 12/26/1990, 24 y.o.   MRN: [id number]  HPI   Pt presents to the clinic today with c/o exposure to chlmydia. She has a sexual partner who told her he tested positive for chlamydia. She has noticed a vaginal discharge and odor. She is not able to tell what color the discharge is because she is on her menstrual cycle. She has had some intermittent pelvic pain as well. She denies urinary symptoms. She denies fever, chills or body aches.  Review of Systems      Past Medical History  Diagnosis Date  . Asthma   . PID (acute pelvic inflammatory disease)   . PID (acute pelvic inflammatory disease)     Current Outpatient Prescriptions  Medication Sig Dispense Refill  . albuterol (PROVENTIL) (2.5 MG/3ML) 0.083% nebulizer solution Take 3 mLs (2.5 mg total) by nebulization every 6 (six) hours as needed. 75 mL 0  . SYMBICORT 160-4.5 MCG/ACT inhaler Inhale 2 puffs two times  daily 30.6 g 0  . azithromycin (ZITHROMAX) 500 MG tablet Take 2 tablets (1,000 mg total) by mouth once. 2 tablet 0   No current facility-administered medications for this visit.    Allergies  Allergen Reactions  . Morphine And Related Nausea And Vomiting    Family History  Problem Relation Age of Onset  . Cancer Mother [id number]    breast    Social History   Social History  . Marital Status: Single    Spouse Name: N/A  . Number of Children: N/A  . Years of Education: N/A   Occupational History  . Not on file.   Social History Main Topics  . Smoking status: Current Some Day Smoker    Types: Cigarettes    Start date: 09/25/2011  . Smokeless tobacco: Never Used     Comment: Occasional  . Alcohol Use: Yes     Comment: OCCASIONAL  . Drug Use: 7.00 per week    Special: Marijuana  . Sexual Activity: Yes   Other Topics Concern  . Not on file   Social History Narrative     Constitutional: Denies fever, malaise, fatigue, headache or abrupt weight  changes.  Respiratory: Denies difficulty breathing, shortness of breath, cough or sputum production.   Cardiovascular: Denies chest pain, chest tightness, palpitations or swelling in the hands or feet.  Gastrointestinal: Denies abdominal pain, bloating, constipation, diarrhea or blood in the stool.  GU: Pt reports pelvic pain, vaginal discharge and odor. Denies urgency, frequency, pain with urination, burning sensation, blood in urine.  No other specific complaints in a complete review of systems (except as listed in HPI above).  Objective:   Physical Exam   BP 110/70 mmHg  Pulse 79  Temp(Src) 98.4 F (36.9 C) (Oral)  Wt 155 lb (70.308 kg)  SpO2 98%  LMP 01/25/2015 Wt Readings from Last 3 Encounters:  01/25/15 155 lb (70.308 kg)  10/16/14 173 lb (78.472 kg)  03/21/13 160 lb (72.576 kg)    General: Appears her stated age, well developed, well nourished in NAD. Cardiovascular: Normal rate and rhythm. S1,S2 noted.  No murmur, rubs or gallops noted.  Pulmonary/Chest: Normal effort and positive vesicular breath sounds. No respiratory distress. No wheezes, rales or ronchi noted.  Abdomen: Soft and nontender.  Pelvic: Deferred because she is on her period.  BMET    Component Value Date/Time   NA 137 03/21/2013 1045   NA 137 12/06/2012 1340  K 3.6 03/21/2013 1045   CL 104 03/21/2013 1045   CO2 26 03/21/2013 1045   GLUCOSE 97 03/21/2013 1045   GLUCOSE 96 12/06/2012 1340   BUN 10 03/21/2013 1045   BUN 12 12/06/2012 1340   CREATININE 0.8 03/21/2013 1045   CALCIUM 9.3 03/21/2013 1045   GFRNONAA 127 12/06/2012 1340   GFRAA 146 12/06/2012 1340    Lipid Panel     Component Value Date/Time   CHOL 207* 12/06/2012 1340   TRIG 137* 12/06/2012 1340   HDL 53 12/06/2012 1340   CHOLHDL 3.9 12/06/2012 1340   LDLCALC 127* 12/06/2012 1340    CBC    Component Value Date/Time   WBC 23.6* 10/17/2014 0513   WBC 9.5 12/06/2012 1340   RBC 3.67* 10/17/2014 0513   RBC 4.45 12/06/2012  1340   HGB 10.4* 10/17/2014 0513   HCT 31.1* 10/17/2014 0513   PLT 186 10/17/2014 0513   MCV 84.8 10/17/2014 0513   MCH 28.3 10/17/2014 0513   MCH 30.8 12/06/2012 1340   MCHC 33.4 10/17/2014 0513   MCHC 32.4 12/06/2012 1340   RDW 15.9* 10/17/2014 0513   RDW 13.6 12/06/2012 1340   LYMPHSABS 3.1 03/21/2013 1045   LYMPHSABS 2.3 12/06/2012 1340   MONOABS 1.0 03/21/2013 1045   EOSABS 0.7 03/21/2013 1045   EOSABS 0.4 12/06/2012 1340   BASOSABS 0.0 03/21/2013 1045   BASOSABS 0.0 12/06/2012 1340    Hgb A1C No results found for: HGBA1C      Assessment & Plan:   Exposure to STD:  Urine GC probe today She declines testing for HIV and RPR Rocephin 250 mg IM today eRx for Azithromax 1000 mg po x 1 Discussed using condoms if having multiple sexual partners  RTC as needed or if symptoms persist or worsen

## 2015-01-25 NOTE — Patient Instructions (Signed)
Chlamydia, Female Chlamydia is an infection. It is spread through sexual contact. Chlamydia can be in different areas of the body. These areas include the cervix, urethra, throat, or rectum. You may not know you have chlamydia because many people never develop the symptoms. Chlamydia is not difficult to treat once you know you have it. However, if it is left untreated, chlamydia can lead to more serious health problems.  CAUSES  Chlamydia is caused by bacteria. It is a sexually transmitted disease. It is passed from an infected partner during intimate contact. This contact could be with the genitals, mouth, or rectal area. Chlamydia can also be passed from mothers to babies during birth. SIGNS AND SYMPTOMS  There may not be any symptoms. This is often the case early in the infection. If symptoms develop, they may include:  Mild pain and discomfort when urinating.  Redness, soreness, and swelling (inflammation) of the rectum.  Vaginal discharge.  Painful intercourse.  Abdominal pain.  Bleeding between menstrual periods. DIAGNOSIS  To diagnose this infection, your health care provider will do a pelvic exam. Cultures will be taken of the vagina, cervix, urine, and possibly the rectum to verify the diagnosis.  TREATMENT You will be given antibiotic medicines. If you are pregnant, certain types of antibiotics will need to be avoided. Any sexual partners should also be treated, even if they do not show symptoms. Your health care provider may test you for infection again 3 months after treatment. HOME CARE INSTRUCTIONS   Take your antibiotic medicine as directed by your health care provider. Finish the antibiotic even if you start to feel better.  Take medicines only as directed by your health care provider.  Inform any sexual partners about the infection. They should also be treated.  Do not have sexual contact until your health care provider tells you it is okay.  Get plenty of  rest.  Eat a well-balanced diet.  Drink enough fluids to keep your urine clear or pale yellow.  Keep all follow-up visits as directed by your health care provider. SEEK MEDICAL CARE IF:  You have painful urination.  You have abdominal pain.  You have vaginal discharge.  You have painful sexual intercourse.  You have bleeding between periods and after sex.  You have a fever. SEEK IMMEDIATE MEDICAL CARE IF:   You experience nausea or vomiting.  You experience excessive sweating (diaphoresis).  You have difficulty swallowing.   This information is not intended to replace advice given to you by your health care provider. Make sure you discuss any questions you have with your health care provider.   Document Released: 11/20/2004 Document Revised: 11/01/2014 Document Reviewed: 10/18/2012 Elsevier Interactive Patient Education 2016 [employer]  

## 2015-01-25 NOTE — Addendum Note (Signed)
Addended by: Alvina ChouWALSH, Mehkai Gallo J on: 01/25/2015 05:11 PM   Modules accepted: Orders

## 2015-01-26 LAB — GC/CHLAMYDIA PROBE AMP, URINE
Chlamydia, Swab/Urine, PCR: NEGATIVE
GC Probe Amp, Urine: NEGATIVE

## 2015-04-20 ENCOUNTER — Other Ambulatory Visit: Payer: Self-pay | Admitting: Family Medicine

## 2015-06-21 ENCOUNTER — Encounter: Payer: Self-pay | Admitting: Emergency Medicine

## 2015-06-21 ENCOUNTER — Emergency Department: Payer: 59

## 2015-06-21 ENCOUNTER — Emergency Department
Admission: EM | Admit: 2015-06-21 | Discharge: 2015-06-21 | Disposition: A | Discharge status: Other Acute Inpatient Hospital | Payer: 59 | Attending: Emergency Medicine | Admitting: Emergency Medicine

## 2015-06-21 DIAGNOSIS — J45909 Unspecified asthma, uncomplicated: Secondary | ICD-10-CM | POA: Diagnosis not present

## 2015-06-21 DIAGNOSIS — Y939 Activity, unspecified: Secondary | ICD-10-CM | POA: Diagnosis not present

## 2015-06-21 DIAGNOSIS — T07XXXA Unspecified multiple injuries, initial encounter: Secondary | ICD-10-CM

## 2015-06-21 DIAGNOSIS — S20212A Contusion of left front wall of thorax, initial encounter: Secondary | ICD-10-CM | POA: Diagnosis not present

## 2015-06-21 DIAGNOSIS — Y929 Unspecified place or not applicable: Secondary | ICD-10-CM | POA: Insufficient documentation

## 2015-06-21 DIAGNOSIS — S022XXA Fracture of nasal bones, initial encounter for closed fracture: Secondary | ICD-10-CM

## 2015-06-21 DIAGNOSIS — F17210 Nicotine dependence, cigarettes, uncomplicated: Secondary | ICD-10-CM | POA: Insufficient documentation

## 2015-06-21 DIAGNOSIS — S02609A Fracture of mandible, unspecified, initial encounter for closed fracture: Secondary | ICD-10-CM

## 2015-06-21 DIAGNOSIS — S0300XA Dislocation of jaw, unspecified side, initial encounter: Secondary | ICD-10-CM

## 2015-06-21 DIAGNOSIS — Y999 Unspecified external cause status: Secondary | ICD-10-CM | POA: Insufficient documentation

## 2015-06-21 DIAGNOSIS — S0302XA Dislocation of jaw, left side, initial encounter: Secondary | ICD-10-CM | POA: Insufficient documentation

## 2015-06-21 DIAGNOSIS — S0993XA Unspecified injury of face, initial encounter: Secondary | ICD-10-CM | POA: Diagnosis present

## 2015-06-21 LAB — POCT PREGNANCY, URINE: PREG TEST UR: NEGATIVE

## 2015-06-21 MED ORDER — ONDANSETRON HCL 4 MG/2ML IJ SOLN
4.0000 mg | Freq: Once | INTRAMUSCULAR | Status: AC
  Administered 2015-06-21: 4 mg via INTRAVENOUS
  Filled 2015-06-21: qty 2

## 2015-06-21 MED ORDER — TETANUS-DIPHTH-ACELL PERTUSSIS 5-2.5-18.5 LF-MCG/0.5 IM SUSP
0.5000 mL | Freq: Once | INTRAMUSCULAR | Status: AC
  Administered 2015-06-21: 0.5 mL via INTRAMUSCULAR
  Filled 2015-06-21: qty 0.5

## 2015-06-21 MED ORDER — SODIUM CHLORIDE 0.9 % IV SOLN
3.0000 g | INTRAVENOUS | Status: AC
  Administered 2015-06-21: 3 g via INTRAVENOUS
  Filled 2015-06-21: qty 3

## 2015-06-21 MED ORDER — SODIUM CHLORIDE 0.9 % IV BOLUS (SEPSIS)
500.0000 mL | INTRAVENOUS | Status: AC
  Administered 2015-06-21: 500 mL via INTRAVENOUS

## 2015-06-21 MED ORDER — HYDROMORPHONE HCL 1 MG/ML IJ SOLN
0.5000 mg | INTRAMUSCULAR | Status: AC
  Administered 2015-06-21: 0.5 mg via INTRAVENOUS
  Filled 2015-06-21: qty 1

## 2015-06-21 NOTE — ED Notes (Signed)
Pts father at bedside per pts request.
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
## 2015-06-28 DIAGNOSIS — S02609A Fracture of mandible, unspecified, initial encounter for closed fracture: Secondary | ICD-10-CM | POA: Insufficient documentation

## 2015-06-28 HISTORY — DX: Fracture of mandible, unspecified, initial encounter for closed fracture: S02.609A

## 2015-12-04 ENCOUNTER — Ambulatory Visit (INDEPENDENT_AMBULATORY_CARE_PROVIDER_SITE_OTHER)
Admission: RE | Admit: 2015-12-04 | Discharge: 2015-12-04 | Disposition: A | Discharge status: Home / Self Care | Payer: 59 | Source: Ambulatory Visit | Attending: Family Medicine | Admitting: Family Medicine

## 2015-12-04 ENCOUNTER — Encounter: Payer: Self-pay | Admitting: Family Medicine

## 2015-12-04 ENCOUNTER — Encounter: Payer: Self-pay | Admitting: *Deleted

## 2015-12-04 ENCOUNTER — Ambulatory Visit: Payer: 59 | Admitting: Family Medicine

## 2015-12-04 ENCOUNTER — Ambulatory Visit (INDEPENDENT_AMBULATORY_CARE_PROVIDER_SITE_OTHER): Payer: 59 | Admitting: Family Medicine

## 2015-12-04 DIAGNOSIS — S99912A Unspecified injury of left ankle, initial encounter: Secondary | ICD-10-CM

## 2015-12-04 DIAGNOSIS — Z0289 Encounter for other administrative examinations: Secondary | ICD-10-CM

## 2015-12-04 NOTE — Progress Notes (Signed)
SUBJECTIVE: Kathryn McgregorJessica M Bush is a 25 y.o. female who complains of inversion injury to the left ankle 3 day(s) ago. Immediate symptoms: immediate pain, immediate swelling, inability to bear weight directly after injury. Symptoms have been constant since that time. Prior history of related problems: no prior problems with this area in the past. There is pain and swelling at the lateral aspect of that ankle.   Current Outpatient Prescriptions on File Prior to Visit  Medication Sig Dispense Refill  . albuterol (PROVENTIL) (2.5 MG/3ML) 0.083% nebulizer solution Take 3 mLs (2.5 mg total) by nebulization every 6 (six) hours as needed. 75 mL 0  . SYMBICORT 160-4.5 MCG/ACT inhaler Inhale 2 puffs two times  daily 30.6 g 0   No current facility-administered medications on file prior to visit.     Allergies  Allergen Reactions  . Morphine And Related Nausea And Vomiting    Past Medical History:  Diagnosis Date  . Asthma   . PID (acute pelvic inflammatory disease)   . PID (acute pelvic inflammatory disease)     No past surgical history on file.  Family History  Problem Relation Age of Onset  . Cancer Mother [id number]    breast    Social History   Social History  . Marital status: Single    Spouse name: N/A  . Number of children: N/A  . Years of education: N/A   Occupational History  . Not on file.   Social History Main Topics  . Smoking status: Current Some Day Smoker    Types: Cigarettes    Start date: 09/25/2011  . Smokeless tobacco: Never Used     Comment: Occasional  . Alcohol use Yes     Comment: OCCASIONAL  . Drug use:     Frequency: 7.0 times per week    Types: Marijuana  . Sexual activity: Yes   Other Topics Concern  . Not on file   Social History Narrative  . No narrative on file   The PMH, PSH, Social History, Family History, Medications, and allergies [facility], and have been updated if relevant.  OBJECTIVE:  BP 122/60   Pulse 83   Temp 98.5 F  (36.9 C) (Other (Comment))   Wt 161 lb 8 oz (73.3 kg)   LMP 11/22/2015   SpO2 97%   BMI 29.54 kg/m   She appears well, vital signs are normal. There is swelling and tenderness over the lateral malleolus. No tenderness over the medial aspect of the ankle. The fifth metatarsal is not tender. The ankle joint is intact without excessive opening on stressing. X-ray: ordered, but results not yet available The rest of the foot, ankle and leg exam is normal.  ASSESSMENT: Ankle  sprain  PLAN: rest the injured area as much as practical, apply ice packs, compressive bandage, X-Ray ordered See orders for this visit as documented in the electronic medical record.

## 2015-12-04 NOTE — Progress Notes (Signed)
Pre visit review using our clinic review tool, if applicable. No additional management support is needed unless otherwise documented below in the visit note. 

## 2015-12-04 NOTE — Patient Instructions (Signed)
Ankle Sprain An ankle sprain is an injury to the strong, fibrous tissues (ligaments) that hold the bones of your ankle joint together.  CAUSES An ankle sprain is usually caused by a fall or by twisting your ankle. Ankle sprains most commonly occur when you step on the outer edge of your foot, and your ankle turns inward. People who participate in sports are more prone to these types of injuries.  SYMPTOMS   Pain in your ankle. The pain may be present at rest or only when you are trying to stand or walk.  Swelling.  Bruising. Bruising may develop immediately or within 1 to 2 days after your injury.  Difficulty standing or walking, particularly when turning corners or changing directions. DIAGNOSIS  Your caregiver will ask you details about your injury and perform a physical exam of your ankle to determine if you have an ankle sprain. During the physical exam, your caregiver will press on and apply pressure to specific areas of your foot and ankle. Your caregiver will try to move your ankle in certain ways. An X-ray exam may be done to be sure a bone was not broken or a ligament did not separate from one of the bones in your ankle (avulsion fracture).   HOME CARE INSTRUCTIONS   Apply ice to your injury for 1-2 days or as directed by your caregiver. Applying ice helps to reduce inflammation and pain.  Put ice in a plastic bag.  Place a towel between your skin and the bag.  Leave the ice on for 15-20 minutes at a time, every 2 hours while you are awake.  Only take over-the-counter or prescription medicines for pain, discomfort, or fever as directed by your caregiver.  Elevate your injured ankle above the level of your heart as much as possible for 2-3 days.  If your caregiver recommends crutches, use them as instructed. Gradually put weight on the affected ankle. Continue to use crutches or a cane until you can walk without feeling pain in your ankle.  If you have a plaster splint, wear  the splint as directed by your caregiver. Do not rest it on anything harder than a pillow for the first 24 hours. Do not put weight on it. Do not get it wet. You may take it off to take a shower or bath.  You may have been given an elastic bandage to wear around your ankle to provide support. If the elastic bandage is too tight (you have numbness or tingling in your foot or your foot becomes cold and blue), adjust the bandage to make it comfortable.  If you have an air splint, you may blow more air into it or let air out to make it more comfortable. You may take your splint off at night and before taking a shower or bath. Wiggle your toes in the splint several times per day to decrease swelling. SEEK MEDICAL CARE IF:   You have rapidly increasing bruising or swelling.  Your toes feel extremely cold or you lose feeling in your foot.  Your pain is not relieved with medicine. SEEK IMMEDIATE MEDICAL CARE IF:  Your toes are numb or blue.  You have severe pain that is increasing. MAKE SURE YOU:   Understand these instructions.  Will watch your condition.  Will get help right away if you are not doing well or get worse.   This information is not intended to replace advice given to you by your health care provider. Make sure you  discuss any questions you have with your health care provider.   Document Released: 02/10/2005 Document Revised: 03/03/2014 Document Reviewed: 02/22/2011 Elsevier Interactive Patient Education [employer]2016 Elsevier Inc.

## 2015-12-04 NOTE — Progress Notes (Deleted)
SUBJECTIVE: Kathryn Bush is a 25 y.o. female who complains of inversion injury to the {gen laterality:315002} ankle *** {gen duration:315003} ago. Immediate symptoms: {sx:315767::"immediate pain"}. Symptoms have been {gen onset/course:315708} since that time. Prior history of related problems: {past sx:315768::"no prior problems with this area in the past"}. There is pain and swelling at the lateral aspect of that ankle.   Current Outpatient Prescriptions on File Prior to Visit  Medication Sig Dispense Refill  . albuterol (PROVENTIL) (2.5 MG/3ML) 0.083% nebulizer solution Take 3 mLs (2.5 mg total) by nebulization every 6 (six) hours as needed. 75 mL 0  . SYMBICORT 160-4.5 MCG/ACT inhaler Inhale 2 puffs two times  daily 30.6 g 0   No current facility-administered medications on file prior to visit.     Allergies  Allergen Reactions  . Morphine And Related Nausea And Vomiting    Past Medical History:  Diagnosis Date  . Asthma   . PID (acute pelvic inflammatory disease)   . PID (acute pelvic inflammatory disease)     No past surgical history on file.  Family History  Problem Relation Age of Onset  . Cancer Mother 5039    breast    Social History   Social History  . Marital status: Single    Spouse name: N/A  . Number of children: N/A  . Years of education: N/A   Occupational History  . Not on file.   Social History Main Topics  . Smoking status: Current Some Day Smoker    Types: Cigarettes    Start date: 09/25/2011  . Smokeless tobacco: Never Used     Comment: Occasional  . Alcohol use Yes     Comment: OCCASIONAL  . Drug use:     Frequency: 7.0 times per week    Types: Marijuana  . Sexual activity: Yes   Other Topics Concern  . Not on file   Social History Narrative  . No narrative on file   The PMH, PSH, Social History, Family History, Medications, and allergies [facility], and have been updated if relevant.  OBJECTIVE: BP 122/60   Pulse  83   Temp 98.5 F (36.9 C) (Other (Comment))   Wt 161 lb 8 oz (73.3 kg)   LMP 11/22/2015   SpO2 97%   BMI 29.54 kg/m   She appears well, vital signs are normal. There is swelling and tenderness over the lateral malleolus. No tenderness over the medial aspect of the ankle. The fifth metatarsal is not tender. The ankle joint is intact without excessive opening on stressing. X-ray: {xr findings:315769} The rest of the foot, ankle and leg exam is normal.  ASSESSMENT: Ankle  {dx:315770::"sprain"}  PLAN: {plan:315771::"rest the injured area as much as practical"} See orders for this visit as documented in the electronic medical record. \

## 2016-06-09 ENCOUNTER — Other Ambulatory Visit: Payer: Self-pay | Admitting: Family Medicine

## 2016-07-01 ENCOUNTER — Emergency Department: Payer: 59

## 2016-07-01 ENCOUNTER — Emergency Department
Admission: EM | Admit: 2016-07-01 | Discharge: 2016-07-02 | Disposition: A | Discharge status: Home / Self Care | Payer: 59 | Attending: Emergency Medicine | Admitting: Emergency Medicine

## 2016-07-01 ENCOUNTER — Encounter: Payer: Self-pay | Admitting: *Deleted

## 2016-07-01 DIAGNOSIS — K226 Gastro-esophageal laceration-hemorrhage syndrome: Secondary | ICD-10-CM | POA: Diagnosis not present

## 2016-07-01 DIAGNOSIS — T5191XA Toxic effect of unspecified alcohol, accidental (unintentional), initial encounter: Secondary | ICD-10-CM

## 2016-07-01 DIAGNOSIS — Z79899 Other long term (current) drug therapy: Secondary | ICD-10-CM | POA: Insufficient documentation

## 2016-07-01 DIAGNOSIS — J45909 Unspecified asthma, uncomplicated: Secondary | ICD-10-CM | POA: Diagnosis not present

## 2016-07-01 DIAGNOSIS — F17210 Nicotine dependence, cigarettes, uncomplicated: Secondary | ICD-10-CM | POA: Diagnosis not present

## 2016-07-01 DIAGNOSIS — T518X1A Toxic effect of other alcohols, accidental (unintentional), initial encounter: Secondary | ICD-10-CM | POA: Diagnosis not present

## 2016-07-01 DIAGNOSIS — R112 Nausea with vomiting, unspecified: Secondary | ICD-10-CM | POA: Diagnosis present

## 2016-07-01 LAB — CBC
HCT: 40.4 % (ref 35.0–47.0)
HEMOGLOBIN: 13.8 g/dL (ref 12.0–16.0)
MCH: 30.9 pg (ref 26.0–34.0)
MCHC: 34.3 g/dL (ref 32.0–36.0)
MCV: 90.2 fL (ref 80.0–100.0)
Platelets: 227 10*3/uL (ref 150–440)
RBC: 4.48 MIL/uL (ref 3.80–5.20)
RDW: 15.1 % — ABNORMAL HIGH (ref 11.5–14.5)
WBC: 17.9 10*3/uL — AB (ref 3.6–11.0)

## 2016-07-01 LAB — URINALYSIS, COMPLETE (UACMP) WITH MICROSCOPIC
Bilirubin Urine: NEGATIVE
Glucose, UA: NEGATIVE mg/dL
HGB URINE DIPSTICK: NEGATIVE
Ketones, ur: NEGATIVE mg/dL
LEUKOCYTES UA: NEGATIVE
Nitrite: NEGATIVE
PROTEIN: 30 mg/dL — AB
Specific Gravity, Urine: 1.024 (ref 1.005–1.030)
pH: 8 (ref 5.0–8.0)

## 2016-07-01 LAB — COMPREHENSIVE METABOLIC PANEL
ALT: 51 U/L (ref 14–54)
ANION GAP: 12 (ref 5–15)
AST: 42 U/L — ABNORMAL HIGH (ref 15–41)
Albumin: 4.1 g/dL (ref 3.5–5.0)
Alkaline Phosphatase: 61 U/L (ref 38–126)
BUN: 14 mg/dL (ref 6–20)
CALCIUM: 9.1 mg/dL (ref 8.9–10.3)
CHLORIDE: 102 mmol/L (ref 101–111)
CO2: 24 mmol/L (ref 22–32)
Creatinine, Ser: 0.63 mg/dL (ref 0.44–1.00)
GFR calc non Af Amer: >60 mL/min (ref >60)
Glucose, Bld: 119 mg/dL — ABNORMAL HIGH (ref 65–99)
Potassium: 3.7 mmol/L (ref 3.5–5.1)
Sodium: 138 mmol/L (ref 135–145)
Total Bilirubin: 0.7 mg/dL (ref 0.3–1.2)
Total Protein: 7.3 g/dL (ref 6.5–8.1)

## 2016-07-01 LAB — LIPASE, BLOOD: LIPASE: 19 U/L (ref 11–51)

## 2016-07-01 LAB — POCT PREGNANCY, URINE: Preg Test, Ur: NEGATIVE

## 2016-07-01 MED ORDER — SODIUM CHLORIDE 0.9 % IV BOLUS (SEPSIS)
1000.0000 mL | Freq: Once | INTRAVENOUS | Status: AC
  Administered 2016-07-01: 1000 mL via INTRAVENOUS

## 2016-07-01 MED ORDER — ONDANSETRON HCL 4 MG/2ML IJ SOLN
4.0000 mg | Freq: Once | INTRAMUSCULAR | Status: AC
  Administered 2016-07-01: 4 mg via INTRAVENOUS
  Filled 2016-07-01: qty 2

## 2016-07-01 MED ORDER — ONDANSETRON 4 MG PO TBDP: 4.0000 mg | ORAL_TABLET | Freq: Three times a day (TID) | ORAL | 0 refills | Status: DC | PRN

## 2016-07-01 NOTE — ED Notes (Signed)
Patient wants to wait until fluids are done. Has about left. Will continue to monitor patient. EDP aware.

## 2016-07-01 NOTE — ED Provider Notes (Signed)
[facility] Emergency Department Provider Note  ____________________________________________   First MD Initiated Contact with Patient 07/01/16 2208     (approximate)  I have reviewed the triage vital signs and the nursing notes.   HISTORY  Chief Complaint Emesis    HPI LEMYA GREENWELL is a 26 y.o. female who comes to the emergency department with moderate to severe epigastric cramping burning discomfort nausea and vomiting that began several hours prior to arrival. I asked night she was out at a bar drinking with her friends and had roughly 12 shots of hard liquor. She does drink regularly but very rarely drinks this much. She got to sleep around 3 AM and when she woke up later today she felt nauseated and had abdominal pain. She went to work and then began to vomit and retch. She noted a small amount of bright red blood in her vomit and she is not been able to keep any fluids down which prompted the visit today. She has no past medical history she has no past surgical history. Her pain is moderate to severe epigastric worse when vomiting improved with rest.   Past Medical History:  Diagnosis Date  . Asthma   . PID (acute pelvic inflammatory disease)   . PID (acute pelvic inflammatory disease)     Patient Active Problem List   Diagnosis Date Noted  . Left ankle injury 12/04/2015  . Delivery of fetus, completely normal case 10/17/2014  . Indication for care in labor or delivery 10/16/2014  . Indication for care or intervention related to labor and delivery 10/16/2014  . Nausea with vomiting 03/21/2013  . Family history of breast cancer in mother 12/09/2012  . Screening for STD (sexually transmitted disease) 12/09/2012  . Breast pain in female 12/09/2012  . Other general counseling and advice for contraceptive management 12/09/2012  . Encounter for routine gynecological examination 12/09/2012  . Routine general medical examination at a health care  facility 11/24/2012  . Asthma 08/14/2011  . History of PID 06/09/2011    No past surgical history on file.  Prior to Admission medications   Medication Sig Start Date End Date Taking? Authorizing Provider  albuterol (PROVENTIL) (2.5 MG/3ML) 0.083% nebulizer solution Take 3 mLs (2.5 mg total) by nebulization every 6 (six) hours as needed. 04/25/13   Dianne Dun, MD  ondansetron (ZOFRAN ODT) 4 MG disintegrating tablet Take 1 tablet (4 mg total) by mouth every 8 (eight) hours as needed for nausea or vomiting. 07/01/16   Merrily Brittle, MD  SYMBICORT 160-4.5 MCG/ACT inhaler Inhale 2 puffs two times  daily 04/17/14   Dianne Dun, MD    Allergies Morphine and related  Family History  Problem Relation Age of Onset  . Cancer Mother 57    breast    Social History Social History  Substance Use Topics  . Smoking status: Current Some Day Smoker    Types: Cigarettes    Start date: 09/25/2011  . Smokeless tobacco: Never Used     Comment: Occasional  . Alcohol use Yes     Comment: OCCASIONAL    Review of Systems Constitutional: No fever/chills Eyes: No visual changes. ENT: No sore throat. Cardiovascular: Denies chest pain. Respiratory: Denies shortness of breath. Gastrointestinal: Positive abdominal pain.  Positive nausea, positive vomiting.  No diarrhea.  No constipation. Genitourinary: Negative for dysuria. Musculoskeletal: Negative for back pain. Skin: Negative for rash. Neurological: Negative for headaches, focal weakness or numbness.  10-point ROS otherwise negative.  ____________________________________________   PHYSICAL EXAM:  VITAL SIGNS: ED Triage Vitals  Enc Vitals Group     BP 07/01/16 2021 135/83     Pulse Rate 07/01/16 2021 (!) 106     Resp 07/01/16 2021 18     Temp 07/01/16 2021 98.6 F (37 C)     Temp Source 07/01/16 2021 Oral     SpO2 07/01/16 2021 98 %     Weight 07/01/16 2023 160 lb (72.6 kg)     Height 07/01/16 2023 5\' 2"  (1.575 m)     Head  Circumference --      Peak Flow --      Pain Score 07/01/16 2029 2     Pain Loc --      Pain Edu? --      Excl. in GC? --     Constitutional: Alert and oriented x 4 well appearing nontoxic no diaphoresis speaks in full, clear sentences Eyes: PERRL EOMI. Head: Atraumatic. Nose: No congestion/rhinnorhea. Mouth/Throat: No trismus Neck: No stridor.   Cardiovascular: Tachycardic rate, regular rhythm. Grossly normal heart sounds.  Good peripheral circulation. Respiratory: Normal respiratory effort.  No retractions. Lungs CTAB and moving good air Gastrointestinal: Soft nondistended nontender no rebound or guarding no peritonitis Musculoskeletal: No lower extremity edema   Neurologic:  Normal speech and language. No gross focal neurologic deficits are appreciated. Skin:  Skin is warm, dry and intact. No rash noted. Psychiatric: Mood and affect are normal. Speech and behavior are normal.    ____________________________________________    ____________________________________________   LABS (all labs ordered are listed, but only abnormal results are displayed)  Labs Reviewed  COMPREHENSIVE METABOLIC PANEL - Abnormal; Notable for the following:       Result Value   Glucose, Bld 119 (*)    AST 42 (*)    All other components within normal limits  CBC - Abnormal; Notable for the following:    WBC 17.9 (*)    RDW 15.1 (*)    All other components within normal limits  URINALYSIS, COMPLETE (UACMP) WITH MICROSCOPIC - Abnormal; Notable for the following:    Color, Urine YELLOW (*)    APPearance CLOUDY (*)    Protein, ur 30 (*)    Bacteria, UA RARE (*)    Squamous Epithelial / LPF 0-5 (*)    All other components within normal limits  LIPASE, BLOOD  POC URINE PREG, ED  POCT PREGNANCY, URINE    Labs unremarkable not pregnant no evidence of pancreatitis __________________________________________  EKG   ____________________________________________  RADIOLOGY  Chest x-ray with  no acute disease no signs of pneumomediastinum ____________________________________________   PROCEDURES  Procedure(s) performed: no  Procedures  Critical Care performed: no  Observation: no ____________________________________________   INITIAL IMPRESSION / ASSESSMENT AND PLAN / ED COURSE  Pertinent labs & imaging results that were available during my care of the patient were reviewed by me and considered in my medical decision making (see chart for details).  The patient arrives well-appearing and has a benign exam. Her significant nausea is likely secondary to alcohol ingestion last night. Her hematemesis is likely secondary to Mallory-Weiss tears. I will give her a liter of fluid, 8 mg of IV Zofran, and obtain a chest x-ray looking for pneumomediastinum.  Fortunately the patient feels improved after fluids and Zofran in her chest x-ray is negative for acute disease. At this point she is stable for outpatient management understands and agrees with the plan.      ____________________________________________  FINAL CLINICAL IMPRESSION(S) / ED DIAGNOSES  Final diagnoses:  Mallory-Weiss tear  Accidental poisoning by alcohol, initial encounter      NEW MEDICATIONS STARTED DURING THIS VISIT:  New Prescriptions   ONDANSETRON (ZOFRAN ODT) 4 MG DISINTEGRATING TABLET    Take 1 tablet (4 mg total) by mouth every 8 (eight) hours as needed for nausea or vomiting.     Note:  This document was prepared using Dragon voice recognition software and may include unintentional dictation errors.     Merrily Brittleifenbark, Shavona Gunderman, MD 07/01/16 2350

## 2016-07-01 NOTE — Discharge Instructions (Signed)
Please make sure you remain well-hydrated and follow-up with your primary care physician as needed. Return to the emergency department for any new or worsening symptoms such as worsening pain, if you cannot eat or drink, or for any other concerns.  It was a pleasure to take care of you today, and thank you for coming to our emergency department.  If you have any questions or concerns before leaving please ask the nurse to grab me and I'm more than happy to go through your aftercare instructions again.  If you were prescribed any opioid pain medication today such as Norco, Vicodin, Percocet, morphine, hydrocodone, or oxycodone please make sure you do not drive when you are taking this medication as it can alter your ability to drive safely.  If you have any concerns once you are home that you are not improving or are in fact getting worse before you can make it to your follow-up appointment, please do not hesitate to call 911 and come back for further evaluation.  Merrily Brittle MD  Results for orders placed or performed during the hospital encounter of 07/01/16  Lipase, blood  Result Value Ref Range   Lipase 19 11 - 51 U/L  Comprehensive metabolic panel  Result Value Ref Range   Sodium 138 135 - 145 mmol/L   Potassium 3.7 3.5 - 5.1 mmol/L   Chloride 102 101 - 111 mmol/L   CO2 24 22 - 32 mmol/L   Glucose, Bld 119 (H) 65 - 99 mg/dL   BUN 14 6 - 20 mg/dL   Creatinine, Ser 4.54 0.44 - 1.00 mg/dL   Calcium 9.1 8.9 - 09.8 mg/dL   Total Protein 7.3 6.5 - 8.1 g/dL   Albumin 4.1 3.5 - 5.0 g/dL   AST 42 (H) 15 - 41 U/L   ALT 51 14 - 54 U/L   Alkaline Phosphatase 61 38 - 126 U/L   Total Bilirubin 0.7 0.3 - 1.2 mg/dL   GFR calc non Af Amer >60 >60 mL/min   GFR calc Af Amer >60 >60 mL/min   Anion gap 12 5 - 15  CBC  Result Value Ref Range   WBC 17.9 (H) 3.6 - 11.0 K/uL   RBC 4.48 3.80 - 5.20 MIL/uL   Hemoglobin 13.8 12.0 - 16.0 g/dL   HCT 11.9 14.7 - 82.9 %   MCV 90.2 80.0 - 100.0 fL   MCH  30.9 26.0 - 34.0 pg   MCHC 34.3 32.0 - 36.0 g/dL   RDW 56.2 (H) 13.0 - 86.5 %   Platelets 227 150 - 440 K/uL  Urinalysis, Complete w Microscopic  Result Value Ref Range   Color, Urine YELLOW (A) YELLOW   APPearance CLOUDY (A) CLEAR   Specific Gravity, Urine 1.024 1.005 - 1.030   pH 8.0 5.0 - 8.0   Glucose, UA NEGATIVE NEGATIVE mg/dL   Hgb urine dipstick NEGATIVE NEGATIVE   Bilirubin Urine NEGATIVE NEGATIVE   Ketones, ur NEGATIVE NEGATIVE mg/dL   Protein, ur 30 (A) NEGATIVE mg/dL   Nitrite NEGATIVE NEGATIVE   Leukocytes, UA NEGATIVE NEGATIVE   RBC / HPF 0-5 0 - 5 RBC/hpf   WBC, UA 0-5 0 - 5 WBC/hpf   Bacteria, UA RARE (A) NONE SEEN   Squamous Epithelial / LPF 0-5 (A) NONE SEEN   Mucous PRESENT    Amorphous Crystal PRESENT   Pregnancy, urine POC  Result Value Ref Range   Preg Test, Ur NEGATIVE NEGATIVE   Dg Chest 2 View  Result Date: 07/01/2016 CLINICAL DATA:  Vomiting today after drinking alcohol last night. EXAM: CHEST  2 VIEW COMPARISON:  12/23/2011 FINDINGS: The heart size and mediastinal contours are within normal limits. Both lungs are clear. The visualized skeletal structures are unremarkable. IMPRESSION: No active cardiopulmonary disease. Electronically Signed   By: Burman NievesWilliam  Stevens M.D.   On: 07/01/2016 22:42

## 2016-07-01 NOTE — ED Triage Notes (Signed)
Pt reports vomiting today after drinking alcohol-tequila  last night.  No abd pain.  Pt reports feeling nauseated.  Pt alert.  Speech clear.

## 2016-08-06 ENCOUNTER — Ambulatory Visit (INDEPENDENT_AMBULATORY_CARE_PROVIDER_SITE_OTHER): Payer: 59 | Admitting: Family Medicine

## 2016-08-06 ENCOUNTER — Encounter: Payer: Self-pay | Admitting: Family Medicine

## 2016-08-06 VITALS — BP 110/62 | HR 89 | Wt 182.0 lb

## 2016-08-06 DIAGNOSIS — N912 Amenorrhea, unspecified: Secondary | ICD-10-CM | POA: Diagnosis not present

## 2016-08-06 DIAGNOSIS — Z3201 Encounter for pregnancy test, result positive: Secondary | ICD-10-CM | POA: Insufficient documentation

## 2016-08-06 DIAGNOSIS — Z3200 Encounter for pregnancy test, result unknown: Secondary | ICD-10-CM | POA: Insufficient documentation

## 2016-08-06 LAB — POCT URINE PREGNANCY: PREG TEST UR: POSITIVE — AB

## 2016-08-06 NOTE — Assessment & Plan Note (Signed)
New- U preg positive. Advised to keep OB appt already scheduled, continue PNV. Letter written stating U preg positive here and given to pt.

## 2016-08-06 NOTE — Progress Notes (Signed)
Pre visit review using our clinic review tool, if applicable. No additional management support is needed unless otherwise documented below in the visit note. 

## 2016-08-06 NOTE — Progress Notes (Signed)
Subjective:   Patient ID: Kathryn Bush, female    DOB: 16-Mar-1990, 26 y.o.   MRN: [id number]  Kathryn Bush is a pleasant 26 y.o. year old female who presents to clinic today with CONFIRM PREGNANCY  on 08/06/2016  HPI:  LMP 06/12/2016  Has taken 5 HPTs, all positive.  Has appointment OB scheduled for end of the month. Was told PCP needed to confirm her pregnancy so she could apply for pregnancy medicaid.  She is taking a daily PNV.  Has had some fatigue, nausea and breast tenderness.  No vaginal bleeding or cramping.  Current Outpatient Prescriptions on File Prior to Visit  Medication Sig Dispense Refill  . albuterol (PROVENTIL) (2.5 MG/3ML) 0.083% nebulizer solution Take 3 mLs (2.5 mg total) by nebulization every 6 (six) hours as needed. 75 mL 0  . SYMBICORT 160-4.5 MCG/ACT inhaler Inhale 2 puffs two times  daily 30.6 g 0   No current facility-administered medications on file prior to visit.     Allergies  Allergen Reactions  . Morphine And Related Nausea And Vomiting    Past Medical History:  Diagnosis Date  . Asthma   . PID (acute pelvic inflammatory disease)   . PID (acute pelvic inflammatory disease)     No past surgical history on file.  Family History  Problem Relation Age of Onset  . Cancer Mother 3039       breast    Social History   Social History  . Marital status: Single    Spouse name: N/A  . Number of children: N/A  . Years of education: N/A   Occupational History  . Not on file.   Social History Main Topics  . Smoking status: Current Some Day Smoker    Types: Cigarettes    Start date: 09/25/2011  . Smokeless tobacco: Never Used     Comment: Occasional  . Alcohol use Yes     Comment: OCCASIONAL  . Drug use: Yes    Frequency: 7.0 times per week    Types: Marijuana  . Sexual activity: Yes   Other Topics Concern  . Not on file   Social History Narrative  . No narrative on file   The PMH, PSH, Social History, Family  History, Medications, and allergies [facility], and have been updated if relevant.   Review of Systems  Constitutional: Positive for fatigue.  Gastrointestinal: Positive for nausea. Negative for vomiting.  Genitourinary: Positive for menstrual problem. Negative for vaginal bleeding and vaginal pain.  All other systems reviewed and are negative.      Objective:    BP 110/62   Pulse 89   Wt 182 lb (82.6 kg)   LMP 06/12/2016   SpO2 98%   BMI 33.29 kg/m    Physical Exam  Constitutional: She is oriented to person, place, and time. She appears well-developed and well-nourished. No distress.  HENT:  Head: Normocephalic and atraumatic.  Eyes: Conjunctivae are normal.  Cardiovascular: Normal rate.   Pulmonary/Chest: Effort normal.  Musculoskeletal: Normal range of motion.  Neurological: She is alert and oriented to person, place, and time. No cranial nerve deficit.  Skin: Skin is warm and dry. She is not diaphoretic.  Psychiatric: She has a normal mood and affect. Her behavior is normal. Judgment and thought content normal.  Nursing note and vitals reviewed.         Assessment & Plan:   Amenorrhea - Plan: POCT urine pregnancy  Possible pregnancy No Follow-up  on file.

## 2016-08-20 ENCOUNTER — Encounter: Payer: Self-pay | Admitting: Family Medicine

## 2016-08-20 ENCOUNTER — Other Ambulatory Visit (HOSPITAL_COMMUNITY)
Admission: RE | Admit: 2016-08-20 | Discharge: 2016-08-20 | Disposition: A | Discharge status: Home / Self Care | Payer: 59 | Source: Ambulatory Visit | Attending: Family Medicine | Admitting: Family Medicine

## 2016-08-20 ENCOUNTER — Ambulatory Visit (INDEPENDENT_AMBULATORY_CARE_PROVIDER_SITE_OTHER): Payer: 59 | Admitting: Family Medicine

## 2016-08-20 ENCOUNTER — Encounter: Payer: Self-pay | Admitting: *Deleted

## 2016-08-20 VITALS — BP 103/70 | HR 93 | Wt 182.0 lb

## 2016-08-20 DIAGNOSIS — N870 Mild cervical dysplasia: Secondary | ICD-10-CM | POA: Diagnosis not present

## 2016-08-20 DIAGNOSIS — Z3689 Encounter for other specified antenatal screening: Secondary | ICD-10-CM | POA: Diagnosis not present

## 2016-08-20 DIAGNOSIS — Z3481 Encounter for supervision of other normal pregnancy, first trimester: Secondary | ICD-10-CM

## 2016-08-20 DIAGNOSIS — Z113 Encounter for screening for infections with a predominantly sexual mode of transmission: Secondary | ICD-10-CM

## 2016-08-20 DIAGNOSIS — Z3480 Encounter for supervision of other normal pregnancy, unspecified trimester: Secondary | ICD-10-CM | POA: Diagnosis present

## 2016-08-20 DIAGNOSIS — O09899 Supervision of other high risk pregnancies, unspecified trimester: Secondary | ICD-10-CM | POA: Insufficient documentation

## 2016-08-20 DIAGNOSIS — Z3A00 Weeks of gestation of pregnancy not specified: Secondary | ICD-10-CM | POA: Insufficient documentation

## 2016-08-20 DIAGNOSIS — Z124 Encounter for screening for malignant neoplasm of cervix: Secondary | ICD-10-CM

## 2016-08-20 DIAGNOSIS — O9989 Other specified diseases and conditions complicating pregnancy, childbirth and the puerperium: Secondary | ICD-10-CM | POA: Diagnosis not present

## 2016-08-20 DIAGNOSIS — R8271 Bacteriuria: Secondary | ICD-10-CM

## 2016-08-20 HISTORY — DX: Encounter for supervision of other normal pregnancy, unspecified trimester: Z34.80

## 2016-08-20 NOTE — Progress Notes (Signed)
   PRENATAL VISIT NOTE  Subjective:  Kathryn Bush is a 26 y.o. G2P1001 at 66235w6d beiElray Mcgregorng seen today for ongoing prenatal care.  She is currently monitored for the following issues for this low-risk pregnancy and has History of PID; Asthma; Family history of breast cancer in mother; Nausea with vomiting; and Supervision of other normal pregnancy, antepartum on her problem list.  Patient reports no complaints.  Contractions: Not present. Vag. Bleeding: None.  Movement: Absent. Denies leaking of fluid.   The following portions of the patient's history were reviewed and updated as appropriate: allergies, current medications, past family history, past medical history, past social history, past surgical history and problem list. Problem list updated.  Objective:   Vitals:   08/20/16 1127  BP: 103/70  Pulse: 93  Weight: 182 lb (82.6 kg)    Fetal Status: Fetal Heart Rate (bpm): 160   Movement: Absent     General:  Alert, oriented and cooperative. Patient is in no acute distress.  Skin: Skin is warm and dry. No rash noted.   Cardiovascular: Normal heart rate noted  Respiratory: Normal respiratory effort, no problems with respiration noted  Abdomen: Soft, gravid, appropriate for gestational age. Pain/Pressure: Absent     Pelvic:  pap collected, cervix appears normal.         Extremities: Normal range of motion.  Edema: None  Mental Status: Normal mood and affect. Normal behavior. Normal judgment and thought content.   Assessment and Plan:  Pregnancy: G2P1001 at 82235w6d  1. Supervision of other normal pregnancy, antepartum - Oriented to practice model and multi provider teams including midwives, fellows and residents - Discussed general first trimester management of sx - Desires FIRST screen -US MFM Fetal Nuchal Translucency; Future - Hemoglobinopathy evaluation - Obstetric Panel, Including HIV - GC/Chlamydia probe amp ([place] - Cystic Fibrosis Mutation 97 - Culture, OB  Urine - Hemoglobin A1c - CMP and Liver - Protein / creatinine ratio, urine - Cytology - PAP - SMN1 Copy Number Analysis  Preterm labor symptoms and general obstetric precautions including but not limited to vaginal bleeding, contractions/cramping, severe abdominal pain  Please refer to After Visit Summary for other counseling recommendations.  Return in about 2 weeks (around 09/03/2016) for Routine prenatal care-baby scripts.   Federico FlakeKimberly Niles Tomer Chalmers, MD

## 2016-08-20 NOTE — Progress Notes (Signed)
Bedside abdominal US performed, SIUP noted with + FHR= 160 and CRL measuring 9w 5d which correlates with LMP.

## 2016-08-21 LAB — PROTEIN / CREATININE RATIO, URINE
Creatinine, Urine: 152.9 mg/dL
Protein, Ur: 10.4 mg/dL
Protein/Creat Ratio: 68 mg/g creat (ref 0–200)

## 2016-08-21 LAB — GC/CHLAMYDIA PROBE AMP (~~LOC~~) NOT AT ARMC
CHLAMYDIA, DNA PROBE: NEGATIVE
Neisseria Gonorrhea: NEGATIVE

## 2016-08-22 LAB — CYTOLOGY - PAP

## 2016-08-25 LAB — URINE CULTURE, OB REFLEX

## 2016-08-25 LAB — CULTURE, OB URINE

## 2016-08-26 LAB — CMP AND LIVER
ALBUMIN: 3.9 g/dL (ref 3.5–5.5)
ALT: 13 IU/L (ref 0–32)
AST: 18 IU/L (ref 0–40)
Alkaline Phosphatase: 45 IU/L (ref 39–117)
BUN: 8 mg/dL (ref 6–20)
Bilirubin Total: 0.3 mg/dL (ref 0.0–1.2)
Bilirubin, Direct: 0.07 mg/dL (ref 0.00–0.40)
CALCIUM: 9.1 mg/dL (ref 8.7–10.2)
CO2: 20 mmol/L (ref 20–29)
CREATININE: 0.58 mg/dL (ref 0.57–1.00)
Chloride: 105 mmol/L (ref 96–106)
GFR calc Af Amer: 149 mL/min/{1.73_m2} (ref >59)
GFR, EST NON AFRICAN AMERICAN: 129 mL/min/{1.73_m2} (ref >59)
GLUCOSE: 93 mg/dL (ref 65–99)
POTASSIUM: 4 mmol/L (ref 3.5–5.2)
SODIUM: 137 mmol/L (ref 134–144)
TOTAL PROTEIN: 6.2 g/dL (ref 6.0–8.5)

## 2016-08-26 LAB — HEMOGLOBINOPATHY EVALUATION
HGB A: 97.7 % (ref 96.4–98.8)
HGB C: 0 %
HGB S: 0 %
HGB VARIANT: 0 %
Hemoglobin A2 Quantitation: 2.3 % (ref 1.8–3.2)
Hemoglobin F Quantitation: 0 % (ref 0.0–2.0)

## 2016-08-26 LAB — OBSTETRIC PANEL, INCLUDING HIV
Antibody Screen: NEGATIVE
BASOS: 0 %
Basophils Absolute: 0 10*3/uL (ref 0.0–0.2)
EOS (ABSOLUTE): 0.3 10*3/uL (ref 0.0–0.4)
EOS: 3 %
HEMATOCRIT: 37.8 % (ref 34.0–46.6)
HEMOGLOBIN: 12.7 g/dL (ref 11.1–15.9)
HEP B S AG: NEGATIVE
HIV Screen 4th Generation wRfx: NONREACTIVE
IMMATURE GRANS (ABS): 0 10*3/uL (ref 0.0–0.1)
IMMATURE GRANULOCYTES: 0 %
LYMPHS: 23 %
Lymphocytes Absolute: 2.2 10*3/uL (ref 0.7–3.1)
MCH: 30.3 pg (ref 26.6–33.0)
MCHC: 33.6 g/dL (ref 31.5–35.7)
MCV: 90 fL (ref 79–97)
MONOCYTES: 7 %
MONOS ABS: 0.7 10*3/uL (ref 0.1–0.9)
Neutrophils Absolute: 6.5 10*3/uL (ref 1.4–7.0)
Neutrophils: 67 %
Platelets: 248 10*3/uL (ref 150–379)
RBC: 4.19 x10E6/uL (ref 3.77–5.28)
RDW: 13.8 % (ref 12.3–15.4)
RH TYPE: POSITIVE
RPR: NONREACTIVE
RUBELLA: 25.1 {index} (ref >0.99)
WBC: 9.6 10*3/uL (ref 3.4–10.8)

## 2016-08-26 LAB — HEMOGLOBIN A1C
ESTIMATED AVERAGE GLUCOSE: 100 mg/dL
HEMOGLOBIN A1C: 5.1 % (ref 4.8–5.6)

## 2016-08-26 LAB — CYSTIC FIBROSIS MUTATION 97: Interpretation: NOT DETECTED

## 2016-08-28 ENCOUNTER — Telehealth: Payer: Self-pay | Admitting: *Deleted

## 2016-08-28 ENCOUNTER — Encounter: Payer: Self-pay | Admitting: Family Medicine

## 2016-08-28 ENCOUNTER — Encounter: Payer: Self-pay | Admitting: *Deleted

## 2016-08-28 DIAGNOSIS — R8271 Bacteriuria: Secondary | ICD-10-CM

## 2016-08-28 DIAGNOSIS — Z3480 Encounter for supervision of other normal pregnancy, unspecified trimester: Secondary | ICD-10-CM

## 2016-08-28 DIAGNOSIS — R87612 Low grade squamous intraepithelial lesion on cytologic smear of cervix (LGSIL): Secondary | ICD-10-CM | POA: Insufficient documentation

## 2016-08-28 HISTORY — DX: Bacteriuria: R82.71

## 2016-08-28 LAB — SMN1 COPY NUMBER ANALYSIS (SMA CARRIER SCREENING)

## 2016-08-28 MED ORDER — AMOXICILLIN 500 MG PO CAPS: 500.0000 mg | ORAL_CAPSULE | Freq: Three times a day (TID) | ORAL | 0 refills | Status: DC

## 2016-08-28 NOTE — Addendum Note (Signed)
Addended by: Geanie BerlinNEWTON, KIMBERLY N on: 08/28/2016 12:11 PM   Modules accepted: Orders

## 2016-08-28 NOTE — Telephone Encounter (Signed)
Called pt, no answer, unable to leave a message due to the mailbox being full.

## 2016-08-28 NOTE — Progress Notes (Signed)
Review of Ucx showed GBS and strep viridins. Will treat with amoxicillin, sent to pharmacy on file . TOC in 2-3 weeks

## 2016-08-28 NOTE — Telephone Encounter (Signed)
-----   Message from Federico FlakeKimberly Niles Newton, MD sent at 08/28/2016 12:10 PM EDT ----- Please call pt 1) routine labs and Carrier screening negative. 2) Ucx with GBS -- will treat with Amoxicillin. I will send to pharmacy 3) Pap LSIL needs to be repeated in July 2019.

## 2016-09-04 ENCOUNTER — Ambulatory Visit (INDEPENDENT_AMBULATORY_CARE_PROVIDER_SITE_OTHER): Payer: 59 | Admitting: Obstetrics & Gynecology

## 2016-09-04 VITALS — BP 116/67 | HR 78 | Wt 177.0 lb

## 2016-09-04 DIAGNOSIS — Z3480 Encounter for supervision of other normal pregnancy, unspecified trimester: Secondary | ICD-10-CM

## 2016-09-04 DIAGNOSIS — Z3481 Encounter for supervision of other normal pregnancy, first trimester: Secondary | ICD-10-CM

## 2016-09-04 DIAGNOSIS — Z3689 Encounter for other specified antenatal screening: Secondary | ICD-10-CM

## 2016-09-04 NOTE — Patient Instructions (Signed)
Return to clinic for any scheduled appointments or obstetric concerns, or go to [facility] for evaluation    Second Trimester of Pregnancy The second trimester is from week 14 through week 27 (months 4 through 6). The second trimester is often a time when you feel your best. Your body has adjusted to being pregnant, and you begin to feel better physically. Usually, morning sickness has lessened or quit completely, you may have more energy, and you may have an increase in appetite. The second trimester is also a time when the fetus is growing rapidly. At the end of the sixth month, the fetus is about 9 inches long and weighs about 1 pounds. You will likely begin to feel the baby move (quickening) between 16 and 20 weeks of pregnancy. Body changes during your second trimester Your body continues to go through many changes during your second trimester. The changes vary from woman to woman.  Your weight will continue to increase. You will notice your lower abdomen bulging out.  You may begin to get stretch marks on your hips, abdomen, and breasts.  You may develop headaches that can be relieved by medicines. The medicines should be approved by your health care provider.  You may urinate more often because the fetus is pressing on your bladder.  You may develop or continue to have heartburn as a result of your pregnancy.  You may develop constipation because certain hormones are causing the muscles that push waste through your intestines to slow down.  You may develop hemorrhoids or swollen, bulging veins (varicose veins).  You may have back pain. This is caused by: ? Weight gain. ? Pregnancy hormones that are relaxing the joints in your pelvis. ? A shift in weight and the muscles that support your balance.  Your breasts will continue to grow and they will continue to become tender.  Your gums may bleed and may be sensitive to brushing and flossing.  Dark spots or blotches (chloasma, mask of  pregnancy) may develop on your face. This will likely fade after the baby is born.  A dark line from your belly button to the pubic area (linea nigra) may appear. This will likely fade after the baby is born.  You may have changes in your hair. These can include thickening of your hair, rapid growth, and changes in texture. Some women also have hair loss during or after pregnancy, or hair that feels dry or thin. Your hair will most likely return to normal after your baby is born.  What to expect at prenatal visits During a routine prenatal visit:  You will be weighed to make sure you and the fetus are growing normally.  Your blood pressure will be taken.  Your abdomen will be measured to track your baby's growth.  The fetal heartbeat will be listened to.  Any test results from the previous visit will be discussed.  Your health care provider may ask you:  How you are feeling.  If you are feeling the baby move.  If you have had any abnormal symptoms, such as leaking fluid, bleeding, severe headaches, or abdominal cramping.  If you are using any tobacco products, including cigarettes, chewing tobacco, and electronic cigarettes.  If you have any questions.  Other tests that may be performed during your second trimester include:  Blood tests that check for: ? Low iron levels (anemia). ? High blood sugar that affects pregnant women (gestational diabetes) between 24 and 28 weeks. ? Rh antibodies. This is to   check for a protein on red blood cells (Rh factor).  Urine tests to check for infections, diabetes, or protein in the urine.  An ultrasound to confirm the proper growth and development of the baby.  An amniocentesis to check for possible genetic problems.  Fetal screens for spina bifida and Down syndrome.  HIV (human immunodeficiency virus) testing. Routine prenatal testing includes screening for HIV, unless you choose not to have this test.  Follow these instructions at  home: Medicines  Follow your health care provider's instructions regarding medicine use. Specific medicines may be either safe or unsafe to take during pregnancy.  Take a prenatal vitamin that contains at least 600 micrograms (mcg) of folic acid.  If you develop constipation, try taking a stool softener if your health care provider approves. Eating and drinking  Eat a balanced diet that includes fresh fruits and vegetables, whole grains, good sources of protein such as meat, eggs, or tofu, and low-fat dairy. Your health care provider will help you determine the amount of weight gain that is right for you.  Avoid raw meat and uncooked cheese. These carry germs that can cause birth defects in the baby.  If you have low calcium intake from food, talk to your health care provider about whether you should take a daily calcium supplement.  Limit foods that are high in fat and processed sugars, such as fried and sweet foods.  To prevent constipation: ? Drink enough fluid to keep your urine clear or pale yellow. ? Eat foods that are high in fiber, such as fresh fruits and vegetables, whole grains, and beans. Activity  Exercise only as directed by your health care provider. Most women can continue their usual exercise routine during pregnancy. Try to exercise for 30 minutes at least 5 days a week. Stop exercising if you experience uterine contractions.  Avoid heavy lifting, wear low heel shoes, and practice good posture.  A sexual relationship may be continued unless your health care provider directs you otherwise. Relieving pain and discomfort  Wear a good support bra to prevent discomfort from breast tenderness.  Take warm sitz baths to soothe any pain or discomfort caused by hemorrhoids. Use hemorrhoid cream if your health care provider approves.  Rest with your legs elevated if you have leg cramps or low back pain.  If you develop varicose veins, wear support hose. Elevate your feet  for 15 minutes, 3-4 times a day. Limit salt in your diet. Prenatal Care  Write down your questions. Take them to your prenatal visits.  Keep all your prenatal visits as told by your health care provider. This is important. Safety  Wear your seat belt at all times when driving.  Make a list of emergency phone numbers, including numbers for family, friends, the hospital, and police and fire departments. General instructions  Ask your health care provider for a referral to a local prenatal education class. Begin classes no later than the beginning of month 6 of your pregnancy.  Ask for help if you have counseling or nutritional needs during pregnancy. Your health care provider can offer advice or refer you to specialists for help with various needs.  Do not use hot tubs, steam rooms, or saunas.  Do not douche or use tampons or scented sanitary pads.  Do not cross your legs for long periods of time.  Avoid cat litter boxes and soil used by cats. These carry germs that can cause birth defects in the baby and possibly loss of the   fetus by miscarriage or stillbirth.  Avoid all smoking, herbs, alcohol, and unprescribed drugs. Chemicals in these products can affect the formation and growth of the baby.  Do not use any products that contain nicotine or tobacco, such as cigarettes and e-cigarettes. If you need help quitting, ask your health care provider.  Visit your dentist if you have not gone yet during your pregnancy. Use a soft toothbrush to brush your teeth and be gentle when you floss. Contact a health care provider if:  You have dizziness.  You have mild pelvic cramps, pelvic pressure, or nagging pain in the abdominal area.  You have persistent nausea, vomiting, or diarrhea.  You have a bad smelling vaginal discharge.  You have pain when you urinate. Get help right away if:  You have a fever.  You are leaking fluid from your vagina.  You have spotting or bleeding from your  vagina.  You have severe abdominal cramping or pain.  You have rapid weight gain or weight loss.  You have shortness of breath with chest pain.  You notice sudden or extreme swelling of your face, hands, ankles, feet, or legs.  You have not felt your baby move in over an hour.  You have severe headaches that do not go away when you take medicine.  You have vision changes. Summary  The second trimester is from week 14 through week 27 (months 4 through 6). It is also a time when the fetus is growing rapidly.  Your body goes through many changes during pregnancy. The changes vary from woman to woman.  Avoid all smoking, herbs, alcohol, and unprescribed drugs. These chemicals affect the formation and growth your baby.  Do not use any tobacco products, such as cigarettes, chewing tobacco, and e-cigarettes. If you need help quitting, ask your health care provider.  Contact your health care provider if you have any questions. Keep all prenatal visits as told by your health care provider. This is important. This information is not intended to replace advice given to you by your health care provider. Make sure you discuss any questions you have with your health care provider. Document Released: 02/04/2001 Document Revised: 07/19/2015 Document Reviewed: 04/13/2012 Elsevier Interactive Patient Education  2017 [employer]  

## 2016-09-04 NOTE — Progress Notes (Signed)
   PRENATAL VISIT NOTE  Subjective:  Kathryn Bush is a 26 y.o. G2P1001 at 3732w0d being seen today for ongoing prenatal care.  She is currently monitored for the following issues for this low-risk pregnancy and has History of PID; Asthma; Family history of breast cancer in mother; Nausea with vomiting; Supervision of other normal pregnancy, antepartum; Low grade squamous intraepith lesion on cytologic smear cervix (lgsil); and GBS bacteriuria on her problem list.  Patient reports no complaints.  Contractions: Not present. Vag. Bleeding: None.  Movement: Absent. Denies leaking of fluid.   The following portions of the patient's history were reviewed and updated as appropriate: allergies, current medications, past family history, past medical history, past social history, past surgical history and problem list. Problem list updated.  Objective:   Vitals:   09/04/16 0939  BP: 116/67  Pulse: 78  Weight: 177 lb (80.3 kg)    Fetal Status: Fetal Heart Rate (bpm): 160   Movement: Absent     General:  Alert, oriented and cooperative. Patient is in no acute distress.  Skin: Skin is warm and dry. No rash noted.   Cardiovascular: Normal heart rate noted  Respiratory: Normal respiratory effort, no problems with respiration noted  Abdomen: Soft, gravid, appropriate for gestational age. Pain/Pressure: Absent     Pelvic:  Cervical exam deferred        Extremities: Normal range of motion.  Edema: Trace  Mental Status: Normal mood and affect. Normal behavior. Normal judgment and thought content.   Assessment and Plan:  Pregnancy: G2P1001 at 3432w0d  1. Encounter for fetal anatomic survey 2. Supervision of other normal pregnancy, antepartum Scheduled for first trimester screen at [facility]. Anatomy scan ordered. AFP screen next visit. - US [facility] OB DETAIL +14 WK; Future Being treated for GBS in urine, TOC urine culture next visit. No other complaints or concerns.  Routine obstetric precautions  reviewed. Please refer to After Visit Summary for other counseling recommendations.  Return in about 8 weeks (around 10/30/2016) for OB 20 week visit (Babyscripts).   Jaynie CollinsUgonna Jovahn Breit, MD

## 2016-09-12 ENCOUNTER — Ambulatory Visit (HOSPITAL_COMMUNITY)
Admission: RE | Admit: 2016-09-12 | Discharge: 2016-09-12 | Disposition: A | Discharge status: Home / Self Care | Payer: 59 | Source: Ambulatory Visit | Attending: Family Medicine | Admitting: Family Medicine

## 2016-09-12 ENCOUNTER — Encounter (HOSPITAL_COMMUNITY): Payer: Self-pay

## 2016-09-12 DIAGNOSIS — Z3682 Encounter for antenatal screening for nuchal translucency: Secondary | ICD-10-CM | POA: Diagnosis present

## 2016-09-12 DIAGNOSIS — Z3A13 13 weeks gestation of pregnancy: Secondary | ICD-10-CM | POA: Insufficient documentation

## 2016-09-12 DIAGNOSIS — Z3480 Encounter for supervision of other normal pregnancy, unspecified trimester: Secondary | ICD-10-CM

## 2016-10-22 ENCOUNTER — Other Ambulatory Visit (HOSPITAL_COMMUNITY): Payer: Self-pay

## 2016-10-23 ENCOUNTER — Ambulatory Visit (HOSPITAL_COMMUNITY)
Admission: RE | Admit: 2016-10-23 | Discharge: 2016-10-23 | Disposition: A | Discharge status: Home / Self Care | Payer: Medicaid Other | Source: Ambulatory Visit | Attending: Obstetrics & Gynecology | Admitting: Obstetrics & Gynecology

## 2016-10-23 DIAGNOSIS — Z3689 Encounter for other specified antenatal screening: Secondary | ICD-10-CM | POA: Diagnosis present

## 2016-10-23 DIAGNOSIS — Z3A19 19 weeks gestation of pregnancy: Secondary | ICD-10-CM | POA: Insufficient documentation

## 2016-10-23 DIAGNOSIS — Z3482 Encounter for supervision of other normal pregnancy, second trimester: Secondary | ICD-10-CM | POA: Insufficient documentation

## 2016-10-23 DIAGNOSIS — Z3480 Encounter for supervision of other normal pregnancy, unspecified trimester: Secondary | ICD-10-CM

## 2016-10-30 ENCOUNTER — Encounter: Payer: 59 | Admitting: Obstetrics & Gynecology

## 2016-11-04 ENCOUNTER — Encounter: Payer: Self-pay | Admitting: Obstetrics and Gynecology

## 2016-11-04 ENCOUNTER — Encounter: Payer: 59 | Admitting: Obstetrics and Gynecology

## 2016-11-04 NOTE — Progress Notes (Signed)
Patient did not keep OB appointment for 11/04/2016.  Kathryn Bush, Jr MD Attending [facility](Faculty Practice)

## 2016-12-04 ENCOUNTER — Telehealth: Payer: Self-pay | Admitting: Radiology

## 2016-12-04 NOTE — Telephone Encounter (Signed)
Left voicemail on cell phone to call [id number], patient has not been seen here at the office since 09/04/16. Requested that patient call us back to confirm that she is either going to another facility for care.

## 2017-03-30 ENCOUNTER — Ambulatory Visit (INDEPENDENT_AMBULATORY_CARE_PROVIDER_SITE_OTHER): Payer: Self-pay | Admitting: Family Medicine

## 2017-03-30 ENCOUNTER — Encounter: Payer: Self-pay | Admitting: Family Medicine

## 2017-03-30 ENCOUNTER — Ambulatory Visit: Payer: Self-pay | Admitting: *Deleted

## 2017-03-30 VITALS — BP 110/70 | HR 78 | Temp 98.4°F | Wt 184.0 lb

## 2017-03-30 DIAGNOSIS — J4521 Mild intermittent asthma with (acute) exacerbation: Secondary | ICD-10-CM

## 2017-03-30 MED ORDER — ALBUTEROL SULFATE HFA 108 (90 BASE) MCG/ACT IN AERS: 2.0000 | INHALATION_SPRAY | RESPIRATORY_TRACT | 2 refills | Status: DC | PRN

## 2017-03-30 MED ORDER — BUDESONIDE-FORMOTEROL FUMARATE 160-4.5 MCG/ACT IN AERO: 2.0000 | INHALATION_SPRAY | Freq: Two times a day (BID) | RESPIRATORY_TRACT | 5 refills | Status: DC

## 2017-03-30 MED ORDER — IPRATROPIUM-ALBUTEROL 0.5-2.5 (3) MG/3ML IN SOLN
3.0000 mL | Freq: Once | RESPIRATORY_TRACT | Status: AC
  Administered 2017-03-30: 3 mL via RESPIRATORY_TRACT

## 2017-03-30 NOTE — Telephone Encounter (Signed)
Pt has 30' appt to see Harlin HeysD Gessner FNP 03/30/17 at 11:00 AM.

## 2017-03-30 NOTE — Telephone Encounter (Signed)
  Answer Assessment - Initial Assessment Questions 1. RESPIRATORY STATUS: "Describe your breathing?" (e.g., wheezing, shortness of breath, unable to speak, severe coughing)      Patient is wheezing and some SOB, coughing 2. ONSET: "When did this breathing problem begin?"      Friday- patient ran out of inhaler 3. PATTERN "Does the difficult breathing come and go, or has it been constant since it started?"      constant 4. SEVERITY: "How bad is your breathing?" (e.g., mild, moderate, severe)    - MILD: No SOB at rest, mild SOB with walking, speaks normally in sentences, can lay down, no retractions, pulse < 100.    - MODERATE: SOB at rest, SOB with minimal exertion and prefers to sit, cannot lie down flat, speaks in phrases, mild retractions, audible wheezing, pulse 100-120.    - SEVERE: Very SOB at rest, speaks in single words, struggling to breathe, sitting hunched forward, retractions, pulse > 120      moderate 5. RECURRENT SYMPTOM: "Have you had difficulty breathing before?" If so, ask: "When was the last time?" and "What happened that time?"      Yes- asthma patient- patient is out of medictaion 6. CARDIAC HISTORY: "Do you have any history of heart disease?" (e.g., heart attack, angina, bypass surgery, angioplasty)      no 7. LUNG HISTORY: "Do you have any history of lung disease?"  (e.g., pulmonary embolus, asthma, emphysema)     asthma 8. CAUSE: "What do you think is causing the breathing problem?"      Asthma- out of medication 9. OTHER SYMPTOMS: "Do you have any other symptoms? (e.g., dizziness, runny nose, cough, chest pain, fever)     Chest tightness 10. PREGNANCY: "Is there any chance you are pregnant?" "When was your last menstrual period?"       No- LMP- 03/10/2017 11. TRAVEL: "Have you traveled out of the country in the last month?" (e.g., travel history, exposures)       no  Protocols used: BREATHING DIFFICULTY-A-AH

## 2017-03-30 NOTE — Patient Instructions (Signed)
Please let me know if you are not breathing better once you get back on you medications.   Please follow up in 6 months, sooner if needed.

## 2017-03-30 NOTE — Progress Notes (Signed)
   Subjective:    Patient ID: Kathryn McgregorJessica M Bush, female    DOB: 1990/07/19, 27 y.o.   MRN: [id number]  HPI This is a 27 yo female who presents today with asthma flare. For three days has had some dry cough, chest tightness. Three days ago she out of her symbicort. Does not have a rescue inhaler. Used a friend's rescue inhaler and had relief for a couple of hours.  Is currently without health insurance.    Past Medical History:  Diagnosis Date  . Asthma   . PID (acute pelvic inflammatory disease)   . PID (acute pelvic inflammatory disease)    Past Surgical History:  Procedure Laterality Date  . MANDIBLE RECONSTRUCTION     Family History  Problem Relation Age of Onset  . Cancer Mother [id number]       breast  . Cancer Father        colon   Social History   Tobacco Use  . Smoking status: Former Smoker    Start date: 09/25/2011  . Smokeless tobacco: Never Used  Substance Use Topics  . Alcohol use: Yes    Comment: OCCASIONAL PRIOR TO PREGNANCY  . Drug use: No      Review of Systems  Constitutional: Negative for fever.  HENT: Negative for rhinorrhea and sore throat.   Respiratory: Positive for cough (dry), chest tightness and shortness of breath.   Cardiovascular: Negative for chest pain and leg swelling.       Objective:   Physical Exam Physical Exam  Constitutional: Oriented to person, place, and time. She appears well-developed and well-nourished.  HENT:  Head: Normocephalic and atraumatic.  Eyes: Conjunctivae are normal.  Neck: Normal range of motion. Neck supple.  Cardiovascular: Normal rate, regular rhythm and normal heart sounds.   Pulmonary/Chest: Effort normal and breath sounds normal.  Musculoskeletal: Normal range of motion.  Neurological: Alert and oriented to person, place, and time.  Skin: Skin is warm and dry.  Psychiatric: Normal mood and affect. Behavior is normal. Judgment and thought content normal.  Vitals reviewed.     BP 110/70 (BP Location:  Right Arm, Patient Position: Sitting, Cuff Size: Normal)   Pulse 78   Temp 98.4 F (36.9 C) (Oral)   Wt 184 lb (83.5 kg)   LMP 06/12/2016 (Approximate)   SpO2 98%   Breastfeeding? Unknown   BMI 33.65 kg/m     Lung sounds clear but she requests neb treatment for tightness. Duo neb given in office with subjective relief of symptoms.   Assessment & Plan:  1. Mild intermittent asthma with acute exacerbation - no signs/symptoms of infection, will get her back on her maintenance inhaler and if not improved in a couple of days or if worsening symptoms, can consider prednisone taper. - budesonide-formoterol (SYMBICORT) 160-4.5 MCG/ACT inhaler; Inhale 2 puffs into the lungs 2 (two) times daily.  Dispense: 10.2 g; Refill: 5 - albuterol (PROVENTIL HFA;VENTOLIN HFA) 108 (90 Base) MCG/ACT inhaler; Inhale 2 puffs into the lungs every 4 (four) hours as needed for wheezing or shortness of breath (cough, shortness of breath or wheezing.).  Dispense: 1 Inhaler; Refill: 2 - ipratropium-albuterol (DUONEB) 0.5-2.5 (3) MG/3ML nebulizer solution 3 mL - follow up in 6 months  Olean Reeeborah Gessner, FNP-BC  [facility], [facility]  03/30/2017 11:33 AM

## 2017-03-30 NOTE — Telephone Encounter (Signed)
Patient reports she has run out of her inhaler prescription- she has been out since Friday. She is having some tightness in her chest and some SOB. She needs an acute appointment for her inhalers. She is offer earlier appointment at other offices- but the patient declines- advised patient to go to UC/ED in her breathing gets worse before appointment. She did mention that she had insurance change and she is not on her father's insurance anymore.Advised her to check with office about that before her appointment to save time- if she should need to go else where. ( She is self pay now)

## 2017-04-14 ENCOUNTER — Emergency Department: Payer: Medicaid Other

## 2017-04-14 ENCOUNTER — Emergency Department
Admission: EM | Admit: 2017-04-14 | Discharge: 2017-04-14 | Disposition: A | Discharge status: Home / Self Care | Payer: Medicaid Other | Attending: Emergency Medicine | Admitting: Emergency Medicine

## 2017-04-14 ENCOUNTER — Encounter: Payer: Self-pay | Admitting: Emergency Medicine

## 2017-04-14 DIAGNOSIS — Z87891 Personal history of nicotine dependence: Secondary | ICD-10-CM | POA: Diagnosis not present

## 2017-04-14 DIAGNOSIS — J45901 Unspecified asthma with (acute) exacerbation: Secondary | ICD-10-CM

## 2017-04-14 DIAGNOSIS — R0602 Shortness of breath: Secondary | ICD-10-CM | POA: Diagnosis present

## 2017-04-14 MED ORDER — PREDNISONE 10 MG (21) PO TBPK: ORAL_TABLET | Freq: Every day | ORAL | 0 refills | Status: DC

## 2017-04-14 MED ORDER — IPRATROPIUM-ALBUTEROL 0.5-2.5 (3) MG/3ML IN SOLN
3.0000 mL | Freq: Once | RESPIRATORY_TRACT | Status: AC
  Administered 2017-04-14: 3 mL via RESPIRATORY_TRACT
  Filled 2017-04-14: qty 3

## 2017-04-14 MED ORDER — PREDNISONE 20 MG PO TABS
60.0000 mg | ORAL_TABLET | Freq: Once | ORAL | Status: AC
  Administered 2017-04-14: 60 mg via ORAL
  Filled 2017-04-14: qty 3

## 2017-04-14 MED ORDER — IPRATROPIUM-ALBUTEROL 0.5-2.5 (3) MG/3ML IN SOLN
3.0000 mL | Freq: Once | RESPIRATORY_TRACT | Status: AC
Start: 2017-04-14 — End: 2017-04-14
  Administered 2017-04-14: 3 mL via RESPIRATORY_TRACT
  Filled 2017-04-14: qty 3

## 2017-04-14 MED ORDER — HYDROCOD POLST-CPM POLST ER 10-8 MG/5ML PO SUER: 5.0000 mL | Freq: Two times a day (BID) | ORAL | 0 refills | Status: DC

## 2017-04-14 NOTE — ED Provider Notes (Signed)
[facility] Emergency Department Provider Note       Time seen: ----------------------------------------- 6:58 AM on 04/14/2017 -----------------------------------------   I have reviewed the triage vital signs and the nursing notes.  HISTORY   Chief Complaint Asthma    HPI Kathryn Bush is a 27 y.o. female with a history of asthma who presents to the ED for shortness of breath since yesterday morning.  Patient reports using her inhaler with no relief.  Patient states typically when she gets a cold she has worsening of her asthma.  She also ran out of her Symbicort yesterday.  She has had some chills but no other flu symptoms.  Past Medical History:  Diagnosis Date  . Asthma   . PID (acute pelvic inflammatory disease)   . PID (acute pelvic inflammatory disease)     Patient Active Problem List   Diagnosis Date Noted  . Low grade squamous intraepith lesion on cytologic smear cervix (lgsil) 08/28/2016  . GBS bacteriuria 08/28/2016  . Supervision of other normal pregnancy, antepartum 08/20/2016  . Fracture of mandible, closed (HCC) 06/28/2015  . Noncompliant pregnant patient 08/29/2014  . Hyperemesis gravidarum 05/09/2014  . Mild intermittent asthma without complication 04/11/2014  . Nausea with vomiting 03/21/2013  . Family history of breast cancer in mother 12/09/2012  . Asthma 08/14/2011  . History of PID 06/09/2011    Past Surgical History:  Procedure Laterality Date  . MANDIBLE RECONSTRUCTION      Allergies Morphine and related  Social History Social History   Tobacco Use  . Smoking status: Former Smoker    Start date: 09/25/2011  . Smokeless tobacco: Never Used  Substance Use Topics  . Alcohol use: Yes    Comment: OCCASIONAL   . Drug use: No    Review of Systems Constitutional: Negative for fever. Eyes: Negative for vision changes ENT: Positive for congestion Cardiovascular: Negative for chest pain. Respiratory: Positive  for shortness of breath and cough Gastrointestinal: Negative for abdominal pain, vomiting and diarrhea. Musculoskeletal: Negative for back pain. Skin: Negative for rash. Neurological: Negative for headaches, focal weakness or numbness.  All systems negative/normal/unremarkable except as stated in the HPI  ____________________________________________   PHYSICAL EXAM:  VITAL SIGNS: ED Triage Vitals  Enc Vitals Group     BP 04/14/17 0347 (!) 123/101     Pulse Rate 04/14/17 0347 96     Resp 04/14/17 0347 20     Temp 04/14/17 0347 98.3 F (36.8 C)     Temp Source 04/14/17 0347 Oral     SpO2 04/14/17 0347 97 %     Weight 04/14/17 0348 180 lb (81.6 kg)     Height 04/14/17 0348 5\' 2"  (1.575 m)     Head Circumference --      Peak Flow --      Pain Score 04/14/17 0348 4     Pain Loc --      Pain Edu? --      Excl. in GC? --    Constitutional: Alert and oriented. Well appearing and in no distress. Eyes: Conjunctivae are normal. Normal extraocular movements. ENT   Head: Normocephalic and atraumatic.   Nose: No congestion/rhinnorhea.   Mouth/Throat: Mucous membranes are moist.   Neck: No stridor. Cardiovascular: Normal rate, regular rhythm. No murmurs, rubs, or gallops. Respiratory: Normal respiratory effort without tachypnea nor retractions. Breath sounds are clear and equal bilaterally.  Minimal wheezing is noted Musculoskeletal: Nontender with normal range of motion in extremities. No lower extremity  tenderness nor edema. Neurologic:  Normal speech and language. No gross focal neurologic deficits are appreciated.  Skin:  Skin is warm, dry and intact. No rash noted. ____________________________________________  ED COURSE:  As part of my medical decision making, I reviewed the following data within the electronic MEDICAL RECORD NUMBER History obtained from family if available, nursing notes, old chart and ekg, as well as notes from prior ED visits. Patient presented for  shortness of breath and cough, we will assess with imaging as indicated at this time.   Procedures ____________________________________________   RADIOLOGY Images were viewed by me  Chest x-ray Is unremarkable ____________________________________________  DIFFERENTIAL DIAGNOSIS   URI, pneumonia, influenza, asthma  FINAL ASSESSMENT AND PLAN  Asthma exacerbation   Plan: Patient had presented for asthma likely exacerbated by URI.  Patient's imaging did not reveal any acute process.  She was given duo nebs and steroids.  She will be discharged with oral steroids and cough suppressants with encouragement to continue using her inhaler.  She is stable for outpatient follow-up.   Ulice DashJohnathan E Williams, MD   Note: This note was generated in part or whole with voice recognition software. Voice recognition is usually quite accurate but there are transcription errors that can and very often do occur. I apologize for any typographical errors that were not detected and corrected.     Emily FilbertWilliams, Jonathan E, MD 04/14/17 [phone]

## 2017-04-14 NOTE — ED Notes (Signed)
Patient reports feeling better after receiving breathing treatment.

## 2017-04-14 NOTE — ED Notes (Signed)
Pt ambulatory upon discharge. Verbalized understanding of discharge instructions, follow-up care and prescriptions. VSS. Skin warm and dry. A&O x4. In NAD at this time.

## 2017-04-14 NOTE — ED Triage Notes (Addendum)
Patient states that she has a history of asthma. Patient reports that she has been having shortness of breath since yesterday morning. Patient reports using her inhaler with no relief. Patient speaking in complete sentences in no acute distress at this time.

## 2017-04-14 NOTE — ED Notes (Signed)
Patient transported to XR. 

## 2017-05-03 IMAGING — CT CT MAXILLOFACIAL W/O CM
3 series · 16 of 47 positions shown, 19 images · non-contrast
Comparison: None.

CLINICAL DATA: Facial trauma with facial swelling and tenderness.

EXAM:
CT MAXILLOFACIAL WITHOUT CONTRAST
TECHNIQUE: Multidetector CT imaging of the maxillofacial structures was
performed. Multiplanar CT image reconstructions were also generated.
A small metallic BB was placed on the right temple in order to
reliably differentiate right from left.

[Series 2: max soft · axial · 0.32mm/px · z∈[-163,-21]mm · 10 of 83 slices shown, 13 images]
[im 6/83  brain]
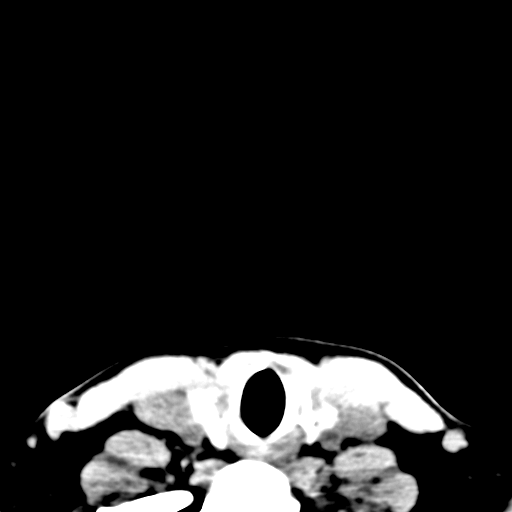
[im 6/83  bone]
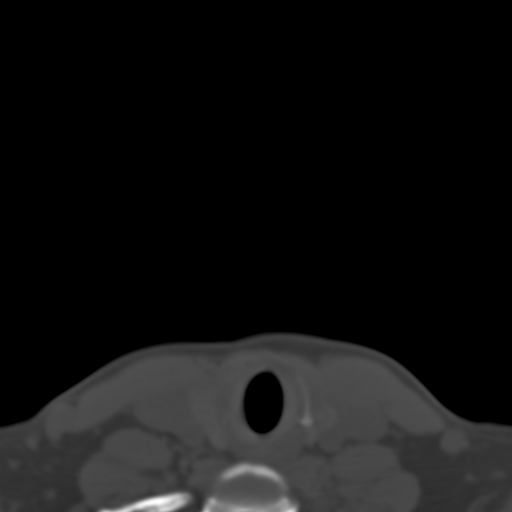
[im 15/83  bone]
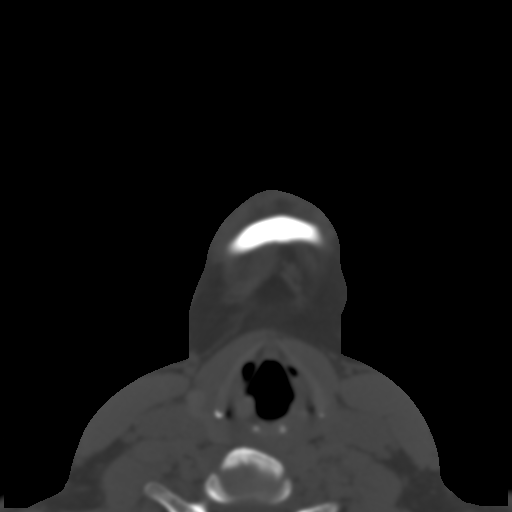
[im 23/83  bone]
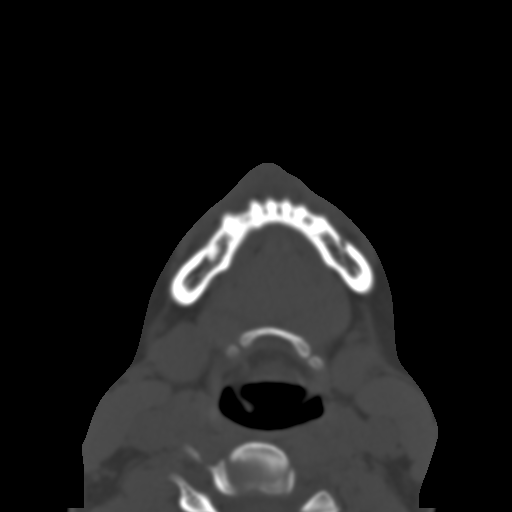
[im 29/83  bone]
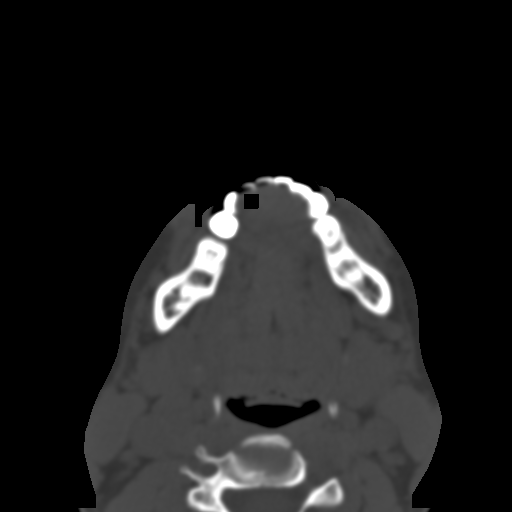
[im 37/83  brain]
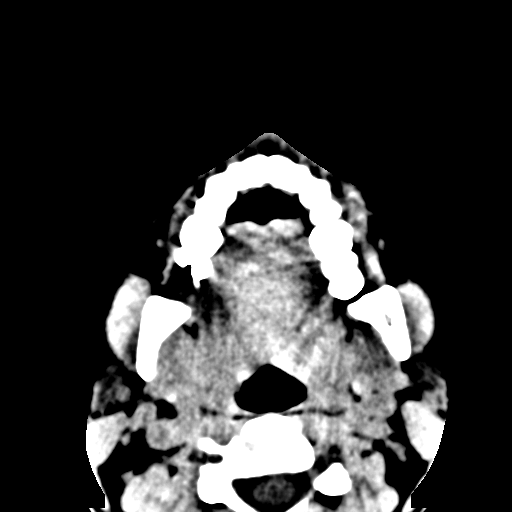
[im 37/83  bone]
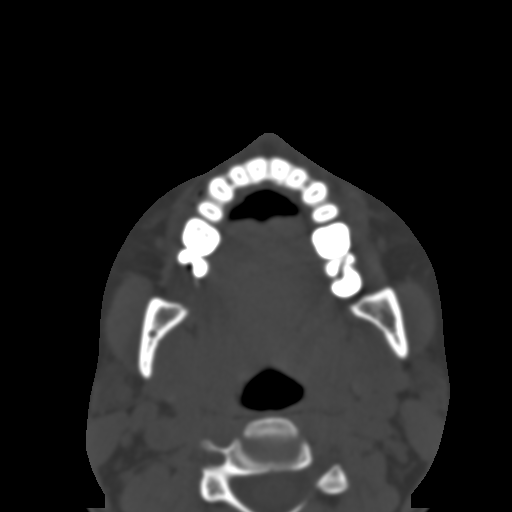
[im 46/83  bone]
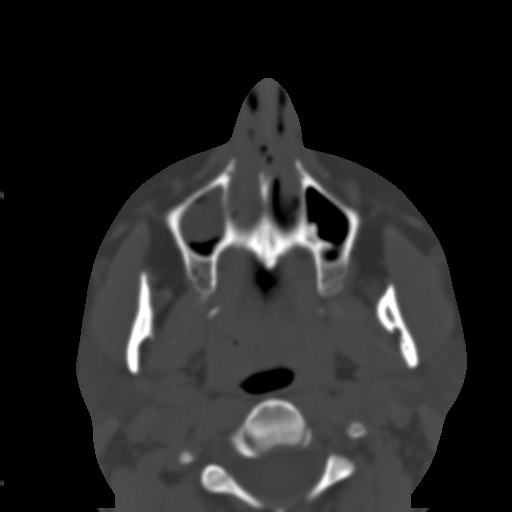
[im 54/83  bone]
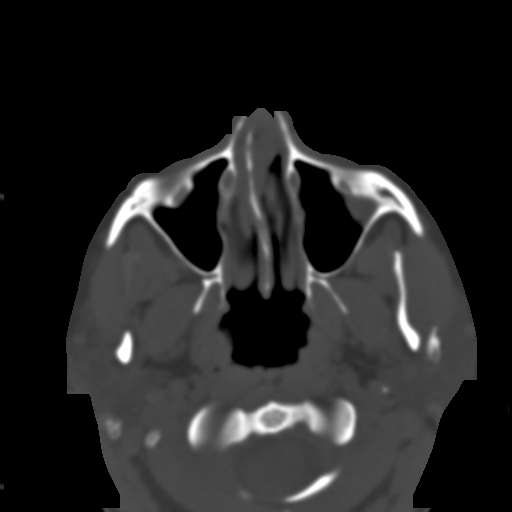
[im 63/83  bone]
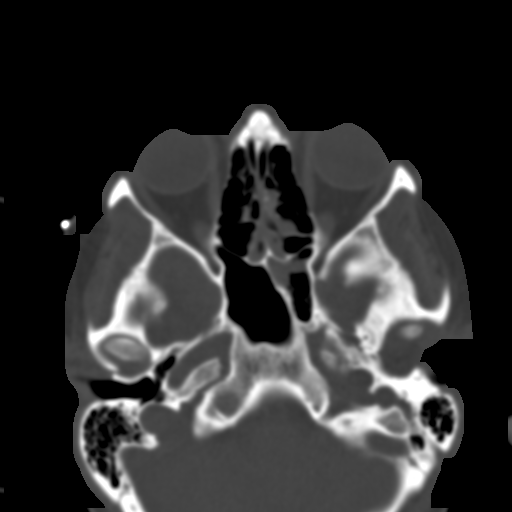
[im 68/83  brain]
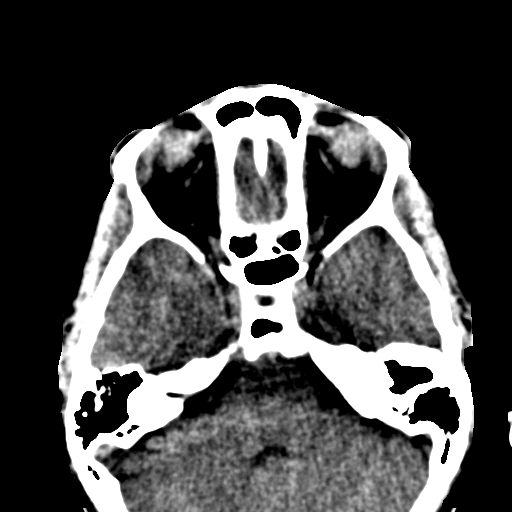
[im 68/83  bone]
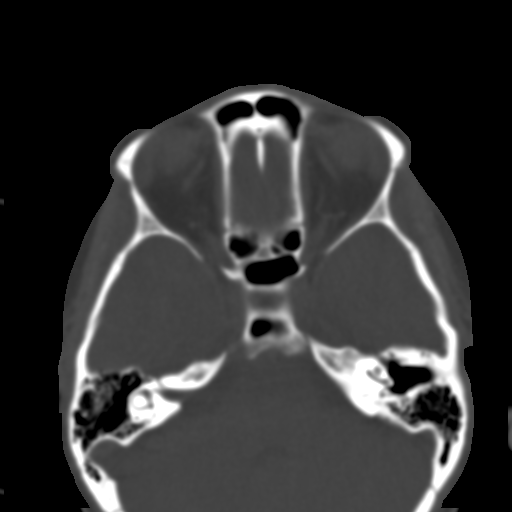
[im 77/83  bone]
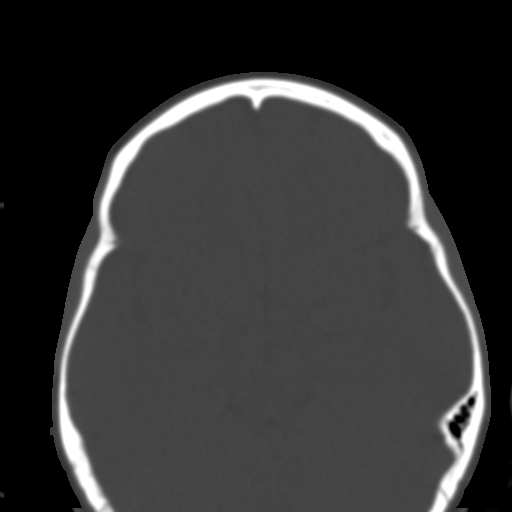

[Series 4: coronal soft · coronal · 0.31mm/px · 3 of 74 slices shown]
[im 25/74  bone]
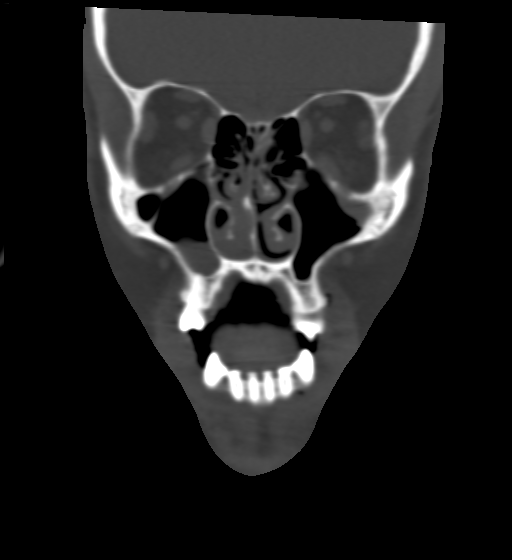
[im 33/74  bone]
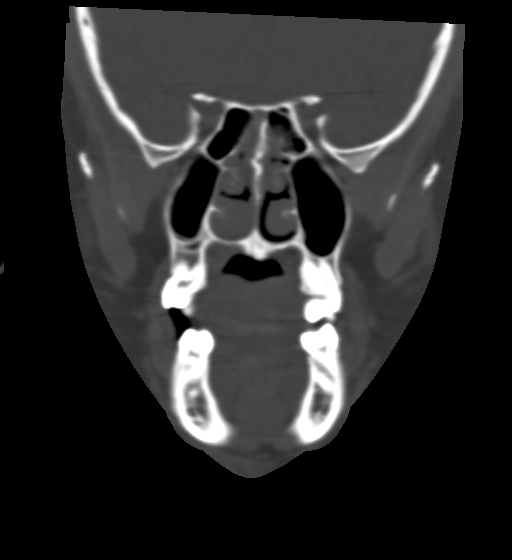
[im 41/74  bone]
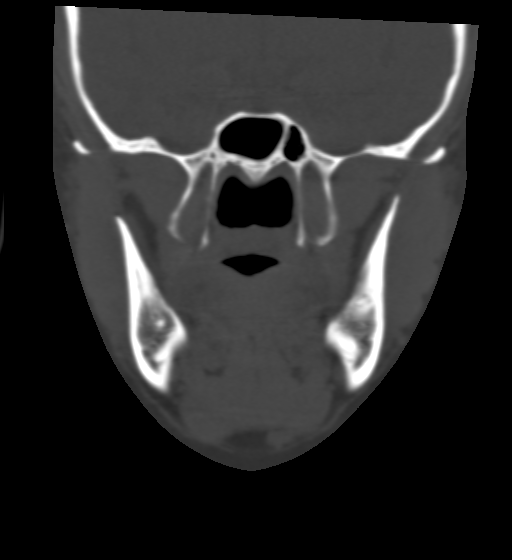

[Series 5: sagittal soft · sagittal · 0.30mm/px · 3 of 71 slices shown]
[im 24/71  bone]
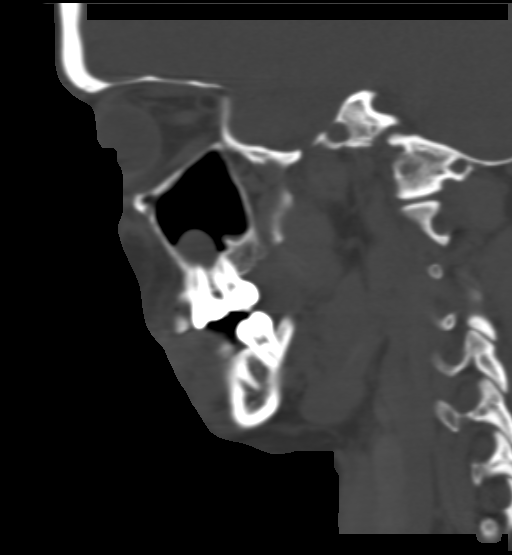
[im 36/71  bone]
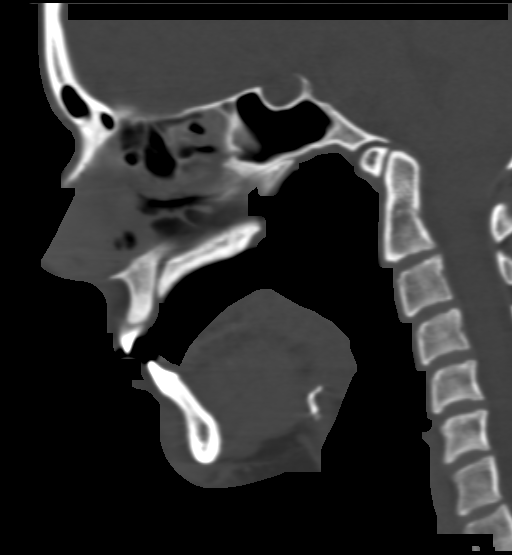
[im 47/71  bone]
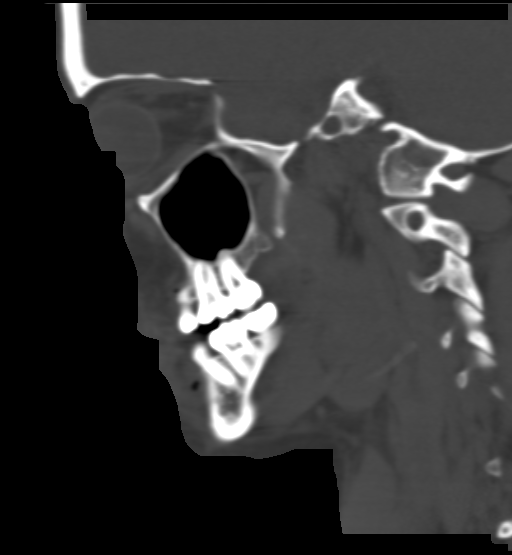

[16 of 47 positions shown; findings below may reference images not displayed]

FINDINGS: Left subcondylar mandible fracture with medially rotated and
dislocated condyle. No other mandible fracture is seen.

Anterior nasal septum fracture with rightward displacement and
septal soft tissue emphysema. There is a nondisplaced right nasal
arch fracture. Nasal bridge soft tissue swelling.

No evidence of globe injury or postseptal hematoma

Scattered inflammatory mucosal thickening and secretion in the
paranasal sinuses.

Low cerebellar tonsils with pointed morphology.

Atlanto occipital non segmentation. Hypoplastic clivus with basilar
invagination.
IMPRESSION: 1. Left subcondylar mandible fracture with dislocated left condyle.
2. Septum and right nasal arch fractures with rightward deviation of
the septum.
3. Anomalous skullbase anatomy with probable Chiari 1 malformation,
as above.

## 2018-05-25 ENCOUNTER — Other Ambulatory Visit: Payer: Self-pay | Admitting: Family Medicine

## 2018-05-25 DIAGNOSIS — J4521 Mild intermittent asthma with (acute) exacerbation: Secondary | ICD-10-CM

## 2018-06-03 ENCOUNTER — Other Ambulatory Visit: Payer: Self-pay

## 2018-06-03 ENCOUNTER — Telehealth: Payer: Self-pay | Admitting: Family Medicine

## 2018-06-03 DIAGNOSIS — J4521 Mild intermittent asthma with (acute) exacerbation: Secondary | ICD-10-CM

## 2018-06-03 MED ORDER — ALBUTEROL SULFATE HFA 108 (90 BASE) MCG/ACT IN AERS: 2.0000 | INHALATION_SPRAY | RESPIRATORY_TRACT | 2 refills | Status: DC | PRN

## 2018-06-03 NOTE — Telephone Encounter (Signed)
rec'd voice message on Rx refill line from pt. Requesting either a refill on Albuterol Inhaler, or Albuterol Soln. For Nebulizer.  Stated in message that she has "pretty bad asthma", and due to being laid off right now with COVID 19 Pandemic, is unable to afford to pay $300.00 for her Symbicort Inhaler.  Also stated she cannot afford an office visit.  Attempted to call pt. To discuss symptoms.  Unable to reach at present; left vm. To call back to office to discuss her symptoms.  Will forward to the office to make Dr. Dayton Martes aware.

## 2018-06-03 NOTE — Telephone Encounter (Signed)
Rx sent to pharmacy with note stating to advise pt when ready/thx dmf

## 2018-07-23 ENCOUNTER — Other Ambulatory Visit: Payer: Self-pay | Admitting: Family Medicine

## 2018-07-23 DIAGNOSIS — J4521 Mild intermittent asthma with (acute) exacerbation: Secondary | ICD-10-CM

## 2018-09-15 ENCOUNTER — Telehealth: Payer: Self-pay

## 2018-09-15 ENCOUNTER — Ambulatory Visit (INDEPENDENT_AMBULATORY_CARE_PROVIDER_SITE_OTHER): Payer: Self-pay

## 2018-09-15 DIAGNOSIS — Z23 Encounter for immunization: Secondary | ICD-10-CM

## 2018-09-15 NOTE — Telephone Encounter (Signed)

## 2018-09-15 NOTE — Progress Notes (Signed)
Pt came into clinic today for Hep A immunization . VIS dated on 09/13/2014, Pt was given in Left Delt. Pt tolerated well .

## 2018-11-23 ENCOUNTER — Other Ambulatory Visit: Payer: Self-pay

## 2018-11-23 DIAGNOSIS — J45909 Unspecified asthma, uncomplicated: Secondary | ICD-10-CM | POA: Insufficient documentation

## 2018-11-23 DIAGNOSIS — R102 Pelvic and perineal pain: Secondary | ICD-10-CM | POA: Insufficient documentation

## 2018-11-23 DIAGNOSIS — Z87891 Personal history of nicotine dependence: Secondary | ICD-10-CM | POA: Insufficient documentation

## 2018-11-23 DIAGNOSIS — Z79899 Other long term (current) drug therapy: Secondary | ICD-10-CM | POA: Insufficient documentation

## 2018-11-23 DIAGNOSIS — N83201 Unspecified ovarian cyst, right side: Secondary | ICD-10-CM | POA: Insufficient documentation

## 2018-11-24 ENCOUNTER — Encounter: Payer: Self-pay | Admitting: Emergency Medicine

## 2018-11-24 ENCOUNTER — Emergency Department: Payer: Self-pay

## 2018-11-24 ENCOUNTER — Emergency Department
Admission: EM | Admit: 2018-11-24 | Discharge: 2018-11-24 | Disposition: A | Discharge status: Home / Self Care | Payer: Self-pay | Attending: Emergency Medicine | Admitting: Emergency Medicine

## 2018-11-24 DIAGNOSIS — R102 Pelvic and perineal pain: Secondary | ICD-10-CM

## 2018-11-24 DIAGNOSIS — N83201 Unspecified ovarian cyst, right side: Secondary | ICD-10-CM

## 2018-11-24 DIAGNOSIS — R52 Pain, unspecified: Secondary | ICD-10-CM

## 2018-11-24 LAB — WET PREP, GENITAL
Clue Cells Wet Prep HPF POC: NONE SEEN
Sperm: NONE SEEN
Trich, Wet Prep: NONE SEEN
Yeast Wet Prep HPF POC: NONE SEEN

## 2018-11-24 LAB — POCT PREGNANCY, URINE: Preg Test, Ur: NEGATIVE

## 2018-11-24 LAB — CBC
HCT: 37.6 % (ref 36.0–46.0)
Hemoglobin: 12.7 g/dL (ref 12.0–15.0)
MCH: 30.4 pg (ref 26.0–34.0)
MCHC: 33.8 g/dL (ref 30.0–36.0)
MCV: 90 fL (ref 80.0–100.0)
Platelets: 258 10*3/uL (ref 150–400)
RBC: 4.18 MIL/uL (ref 3.87–5.11)
RDW: 13 % (ref 11.5–15.5)
WBC: 11.1 10*3/uL — ABNORMAL HIGH (ref 4.0–10.5)
nRBC: 0 % (ref 0.0–0.2)

## 2018-11-24 LAB — COMPREHENSIVE METABOLIC PANEL
ALT: 15 U/L (ref 0–44)
AST: 16 U/L (ref 15–41)
Albumin: 3.8 g/dL (ref 3.5–5.0)
Alkaline Phosphatase: 46 U/L (ref 38–126)
Anion gap: 7 (ref 5–15)
BUN: 16 mg/dL (ref 6–20)
CO2: 24 mmol/L (ref 22–32)
Calcium: 8.8 mg/dL — ABNORMAL LOW (ref 8.9–10.3)
Chloride: 107 mmol/L (ref 98–111)
Creatinine, Ser: 0.84 mg/dL (ref 0.44–1.00)
GFR calc Af Amer: >60 mL/min (ref >60)
GFR calc non Af Amer: >60 mL/min (ref >60)
Glucose, Bld: 108 mg/dL — ABNORMAL HIGH (ref 70–99)
Potassium: 3.6 mmol/L (ref 3.5–5.1)
Sodium: 138 mmol/L (ref 135–145)
Total Bilirubin: 0.4 mg/dL (ref 0.3–1.2)
Total Protein: 6.7 g/dL (ref 6.5–8.1)

## 2018-11-24 LAB — URINALYSIS, COMPLETE (UACMP) WITH MICROSCOPIC
Bilirubin Urine: NEGATIVE
Glucose, UA: NEGATIVE mg/dL
Ketones, ur: NEGATIVE mg/dL
Leukocytes,Ua: NEGATIVE
Nitrite: NEGATIVE
Protein, ur: NEGATIVE mg/dL
Specific Gravity, Urine: 1.008 (ref 1.005–1.030)
pH: 6 (ref 5.0–8.0)

## 2018-11-24 MED ORDER — AZITHROMYCIN 1 G PO PACK
1.0000 g | PACK | Freq: Once | ORAL | Status: AC
  Administered 2018-11-24: 05:00:00 1 g via ORAL
  Filled 2018-11-24: qty 1

## 2018-11-24 MED ORDER — OXYCODONE-ACETAMINOPHEN 5-325 MG PO TABS: 1.0000 | ORAL_TABLET | ORAL | 0 refills | Status: DC | PRN

## 2018-11-24 MED ORDER — CEFTRIAXONE SODIUM 250 MG IJ SOLR
250.0000 mg | Freq: Once | INTRAMUSCULAR | Status: AC
  Administered 2018-11-24: 250 mg via INTRAMUSCULAR
  Filled 2018-11-24: qty 250

## 2018-11-24 MED ORDER — IBUPROFEN 800 MG PO TABS: 800.0000 mg | ORAL_TABLET | Freq: Three times a day (TID) | ORAL | 0 refills | Status: DC | PRN

## 2018-11-24 MED ORDER — ONDANSETRON 4 MG PO TBDP
4.0000 mg | ORAL_TABLET | Freq: Once | ORAL | Status: AC
  Administered 2018-11-24: 4 mg via ORAL
  Filled 2018-11-24: qty 1

## 2018-11-24 NOTE — Discharge Instructions (Signed)
1.  You may take pain medicines as needed (Motrin/Percocet #15). 2.  You have been treated with antibiotics for STD concern. 3.  Return to the ER for worsening symptoms, persistent vomiting, difficulty breathing or other concerns.

## 2018-11-24 NOTE — ED Triage Notes (Signed)
Patient to ER for c/o bilateral pelvic pain with increased pelvic pain with urination. Denies any burning at urethra with urinating. Patient states she was at a bar and developed sudden onset of pain.

## 2018-11-24 NOTE — ED Notes (Addendum)
Patient returned from US.

## 2018-11-24 NOTE — ED Provider Notes (Signed)
[facility] Emergency Department Provider Note   ____________________________________________   First MD Initiated Contact with Patient 11/24/18 0038     (approximate)  I have reviewed the triage vital signs and the nursing notes.   HISTORY  Chief Complaint Pelvic Pain    HPI Kathryn Bush is a 28 y.o. female who presents to the ED from a bar with a chief complaint of sudden pelvic pain.  Had unprotected sexual intercourse 4 days ago while on a business trip.  Tonight she was at a bar when she experienced sudden onset of bilateral pelvic pain.  Denies fever, cough, chest pain, shortness of breath, nausea, vomiting, dysuria, vaginal bleeding or discharge.       Past Medical History:  Diagnosis Date   Asthma    PID (acute pelvic inflammatory disease)    PID (acute pelvic inflammatory disease)     Patient Active Problem List   Diagnosis Date Noted   Low grade squamous intraepith lesion on cytologic smear cervix (lgsil) 08/28/2016   GBS bacteriuria 08/28/2016   Supervision of other normal pregnancy, antepartum 08/20/2016   Fracture of mandible, closed ([place]) 06/28/2015   Noncompliant pregnant patient 08/29/2014   Hyperemesis gravidarum 05/09/2014   Mild intermittent asthma without complication 97/35/3299   Nausea with vomiting 03/21/2013   Family history of breast cancer in mother 12/09/2012   Asthma 08/14/2011   History of PID 06/09/2011    Past Surgical History:  Procedure Laterality Date   MANDIBLE RECONSTRUCTION      Prior to Admission medications   Medication Sig Start Date End Date Taking? Authorizing Provider  acetaminophen (TYLENOL) 500 MG tablet Take 500 mg by mouth every 6 (six) hours as needed.    [provider]  albuterol (VENTOLIN HFA) 108 (90 Base) MCG/ACT inhaler INHALE 2 PUFFS INTO THE LUNGS EVERY 4 (FOUR) HOURS AS NEEDED FOR WHEEZING OR SHORTNESS OF BREATH (COUGH, SHORTNESS OF BREATH OR WHEEZING.).  07/23/18   Lucille Passy, MD  chlorpheniramine-HYDROcodone [facility] PENNKINETIC ER) 10-8 MG/5ML SUER Take 5 mLs by mouth 2 (two) times daily. 04/14/17   Earleen Newport, MD  ibuprofen (ADVIL) 800 MG tablet Take 1 tablet (800 mg total) by mouth every 8 (eight) hours as needed for moderate pain. 11/24/18   Paulette Blanch, MD  oxyCODONE-acetaminophen (PERCOCET/ROXICET) 5-325 MG tablet Take 1 tablet by mouth every 4 (four) hours as needed for severe pain. 11/24/18   Paulette Blanch, MD  predniSONE (STERAPRED UNI-PAK 21 TAB) 10 MG (21) TBPK tablet Take by mouth daily. Dispense steroid taper pack as directed 04/14/17   Earleen Newport, MD  SYMBICORT 160-4.5 MCG/ACT inhaler INHALE 2 PUFFS INTO THE LUNGS 2 TIMES DAILY 05/26/18   Elby Beck, FNP    Allergies Morphine and related  Family History  Problem Relation Age of Onset   Cancer Mother 24       breast   Cancer Father        colon    Social History Social History   Tobacco Use   Smoking status: Former Smoker    Start date: 09/25/2011   Smokeless tobacco: Never Used  Substance Use Topics   Alcohol use: Yes    Comment: OCCASIONAL    Drug use: No    Review of Systems  Constitutional: No fever/chills Eyes: No visual changes. ENT: No sore throat. Cardiovascular: Denies chest pain. Respiratory: Denies shortness of breath. Gastrointestinal: Positive for pelvic pain.  No abdominal pain.  No nausea,  no vomiting.  No diarrhea.  No constipation. Genitourinary: Negative for dysuria. Musculoskeletal: Negative for back pain. Skin: Negative for rash. Neurological: Negative for headaches, focal weakness or numbness.   ____________________________________________   PHYSICAL EXAM:  VITAL SIGNS: ED Triage Vitals [11/24/18 0016]  Enc Vitals Group     BP (!) 150/98     Pulse Rate 84     Resp 20     Temp 98.5 F (36.9 C)     Temp Source Oral     SpO2 100 %     Weight 168 lb (76.2 kg)     Height 5\' 2"  (1.575 m)     Head  Circumference      Peak Flow      Pain Score 3     Pain Loc      Pain Edu?      Excl. in GC?     Constitutional: Alert and oriented. Well appearing and in no acute distress. Eyes: Conjunctivae are normal. PERRL. EOMI. Head: Atraumatic. Nose: No congestion/rhinnorhea. Mouth/Throat: Mucous membranes are moist.  Oropharynx non-erythematous. Neck: No stridor.   Cardiovascular: Normal rate, regular rhythm. Grossly normal heart sounds.  Good peripheral circulation. Respiratory: Normal respiratory effort.  No retractions. Lungs CTAB. Gastrointestinal: Soft and minimally tender to palpation pelvis without rebound or guarding. No distention. No abdominal bruits. No CVA tenderness. Musculoskeletal: No lower extremity tenderness nor edema.  No joint effusions. Neurologic:  Normal speech and language. No gross focal neurologic deficits are appreciated. No gait instability. Skin:  Skin is warm, dry and intact. No rash noted. Psychiatric: Mood and affect are normal. Speech and behavior are normal.  ____________________________________________   LABS (all labs ordered are listed, but only abnormal results are displayed)  Labs Reviewed  WET PREP, GENITAL - Abnormal; Notable for the following components:      Result Value   WBC, Wet Prep HPF POC FEW (*)    All other components within normal limits  COMPREHENSIVE METABOLIC PANEL - Abnormal; Notable for the following components:   Glucose, Bld 108 (*)    Calcium 8.8 (*)    All other components within normal limits  CBC - Abnormal; Notable for the following components:   WBC 11.1 (*)    All other components within normal limits  URINALYSIS, COMPLETE (UACMP) WITH MICROSCOPIC - Abnormal; Notable for the following components:   Color, Urine STRAW (*)    APPearance CLEAR (*)    Hgb urine dipstick SMALL (*)    Bacteria, UA RARE (*)    All other components within normal limits  GC/CHLAMYDIA PROBE AMP  POC URINE PREG, ED  POCT PREGNANCY, URINE    ____________________________________________  EKG  None ____________________________________________  RADIOLOGY  ED MD interpretation: Complex 3.5 cm right ovarian hemorrhagic cyst, no torsion  Official radiology report(s): [place] Pelvic Complete W Transvaginal And Torsion R/o  Result Date: 11/24/2018 CLINICAL DATA:  Pelvic pain. EXAM: TRANSABDOMINAL AND TRANSVAGINAL ULTRASOUND OF PELVIS DOPPLER ULTRASOUND OF OVARIES TECHNIQUE: Both transabdominal and transvaginal ultrasound examinations of the pelvis were performed. Transabdominal technique was performed for global imaging of the pelvis including uterus, ovaries, adnexal regions, and pelvic cul-de-sac. It was necessary to proceed with endovaginal exam following the transabdominal exam to visualize the right and left ovary. Color and duplex Doppler ultrasound was utilized to evaluate blood flow to the ovaries. COMPARISON:  Remote pelvic ultrasound 02/11/2011 FINDINGS: Uterus Measurements: 8.3 x 4.6 x 5.7 cm = volume: 115 mL. No fibroids or other mass visualized. Small nabothian  cysts in the cervix. Endometrium Thickness: 8 mm, normal.  No focal abnormality visualized. Right ovary Measurements: 5.1 x 3.3 x 4.0 cm = volume: 35.2 mL. Complex cyst within the right ovary measuring 3.5 x 1.6 x 2.2 cm has lacy internal echoes. Normal blood flow is seen. Left ovary Measurements: 3.5 x 1.9 x 2.1 cm = volume: 7.2 mL. Normal appearance with blood flow. No adnexal mass. Pulsed Doppler evaluation of both ovaries demonstrates normal low-resistance arterial and venous waveforms. Other findings Small amount of free fluid in the pelvis is likely physiologic. IMPRESSION: 1. Complex 3.5 cm right ovarian cyst is likely hemorrhagic cyst. Normal ovarian blood flow without torsion. 2. Otherwise normal pelvic ultrasound. Electronically Signed   By: Narda Rutherford M.D.   On: 11/24/2018 03:58    ____________________________________________   PROCEDURES  Procedure(s)  performed (including Critical Care):  Procedures   ____________________________________________   INITIAL IMPRESSION / ASSESSMENT AND PLAN / ED COURSE  As part of my medical decision making, I reviewed the following data within the electronic MEDICAL RECORD NUMBER Nursing notes reviewed and incorporated, Labs reviewed, Old chart reviewed, Radiograph reviewed, Notes from prior ED visits and [place] Controlled Substance Database     Kathryn Bush was evaluated in Emergency Department on 11/24/2018 for the symptoms described in the history of present illness. She was evaluated in the context of the global COVID-19 pandemic, which necessitated consideration that the patient might be at risk for infection with the SARS-CoV-2 virus that causes COVID-19. Institutional protocols and algorithms that pertain to the evaluation of patients at risk for COVID-19 are in a state of rapid change based on information released by regulatory bodies including the CDC and federal and state organizations. These policies and algorithms were followed during the patient's care in the ED.   28 year old female who presents with sudden onset pelvic pain. Differential diagnosis includes, but is not limited to, ovarian cyst, ovarian torsion, acute appendicitis, diverticulitis, urinary tract infection/pyelonephritis, endometriosis, bowel obstruction, colitis, renal colic, gastroenteritis, hernia, fibroids, endometriosis, pregnancy related pain including ectopic pregnancy, etc.   Clinical Course as of Nov 23 628  Wed Nov 24, 2018  0418 Updated patient on all test results.  Will empirically treat for concern for STD.  Strict return precautions given.  Patient verbalizes understanding and agrees with plan of care.   [JS]    Clinical Course User Index [JS] Irean Hong, MD     ____________________________________________   FINAL CLINICAL IMPRESSION(S) / ED DIAGNOSES  Final diagnoses:  Pain  Pelvic pain in female  Cyst of  right ovary     ED Discharge Orders         Ordered    ibuprofen (ADVIL) 800 MG tablet  Every 8 hours PRN     11/24/18 0423    oxyCODONE-acetaminophen (PERCOCET/ROXICET) 5-325 MG tablet  Every 4 hours PRN     11/24/18 0423           Note:  This document was prepared using Dragon voice recognition software and may include unintentional dictation errors.   Irean Hong, MD 11/24/18 0630

## 2018-11-24 NOTE — ED Notes (Signed)
Patient having lower Abdominal pain that started yesterday that became worse this evening. Patient denies and vaginal discharge. Patient states urine very yellow with smell like dehydration. Patient states had unprotected sex 4 days ago.

## 2018-12-17 ENCOUNTER — Other Ambulatory Visit: Payer: Self-pay | Admitting: Family Medicine

## 2018-12-17 DIAGNOSIS — J4521 Mild intermittent asthma with (acute) exacerbation: Secondary | ICD-10-CM

## 2018-12-17 NOTE — Telephone Encounter (Signed)
Last fill 07/23/18  #8.5/2 Last ov 2018 Ok to fill?

## 2019-03-23 ENCOUNTER — Other Ambulatory Visit: Payer: Self-pay | Admitting: Family Medicine

## 2019-03-23 DIAGNOSIS — J4521 Mild intermittent asthma with (acute) exacerbation: Secondary | ICD-10-CM

## 2019-03-23 NOTE — Telephone Encounter (Signed)
Last OV 2018 Last fill 12/17/18  #18g/2

## 2019-03-24 ENCOUNTER — Other Ambulatory Visit: Payer: Self-pay

## 2019-03-24 DIAGNOSIS — J4521 Mild intermittent asthma with (acute) exacerbation: Secondary | ICD-10-CM

## 2019-03-24 MED ORDER — ALBUTEROL SULFATE HFA 108 (90 BASE) MCG/ACT IN AERS: 2.0000 | INHALATION_SPRAY | RESPIRATORY_TRACT | 0 refills | Status: DC | PRN

## 2019-04-13 LAB — GC/CHLAMYDIA PROBE AMP
Chlamydia trachomatis, NAA: NEGATIVE
Neisseria Gonorrhoeae by PCR: NEGATIVE

## 2019-04-27 ENCOUNTER — Ambulatory Visit (INDEPENDENT_AMBULATORY_CARE_PROVIDER_SITE_OTHER): Payer: Self-pay | Admitting: Family Medicine

## 2019-04-27 ENCOUNTER — Other Ambulatory Visit: Payer: Self-pay

## 2019-04-27 ENCOUNTER — Telehealth: Payer: Self-pay

## 2019-04-27 ENCOUNTER — Encounter: Payer: Self-pay | Admitting: Family Medicine

## 2019-04-27 DIAGNOSIS — J4520 Mild intermittent asthma, uncomplicated: Secondary | ICD-10-CM

## 2019-04-27 MED ORDER — ALBUTEROL SULFATE HFA 108 (90 BASE) MCG/ACT IN AERS: 2.0000 | INHALATION_SPRAY | RESPIRATORY_TRACT | 0 refills | Status: DC | PRN

## 2019-04-27 NOTE — Patient Instructions (Addendum)
Take a daily over the counter antihistamine- cetirizine, loratadine  Follow up with Rene Kocher next week and I will give her information regarding ordering inhaler from [place]

## 2019-04-27 NOTE — Telephone Encounter (Signed)
Pt already has appt 04/27/19 at 11:45 with Harlin Heys FNP.

## 2019-04-27 NOTE — Progress Notes (Signed)
   Subjective:    Patient ID: Kathryn Bush, female    DOB: Mar 29, 1990, 29 y.o.   MRN: [id number]  HPI This is a 29 yo female who presents today for follow up of asthma.  She was previously a patient of Dr. Clifton Custard and will be transferring care to [facility] later this month.  She needs a refill of her albuterol inhaler.  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 She is having to use her inhaler while working as wearing a mask seems to exacerbate symptoms. She has tight chest, wheeze, some relief with albuterol use. Seemed to get worse after getting off Symbicort. No known environmental triggers. Some nighttime use. Asthma since childhood. Multiple allergies.  Has been considering allergy shots.  No fever, productive cough.  No smoking or vaping.    Review of Systems Per HPI    Objective:   Physical Exam Physical Exam  Constitutional: Oriented to person, place, and time. Appears well-developed and well-nourished.  HENT:  Head: Normocephalic and atraumatic.  Eyes: Conjunctivae are normal.  Neck: Normal range of motion. Neck supple.  Cardiovascular: Normal rate, regular rhythm and normal heart sounds.   Pulmonary/Chest: Effort normal. Musculoskeletal: Normal range of motion.  Neurological: Alert and oriented to person, place, and time.   Psychiatric: Normal mood and affect. Behavior is normal. Judgment and thought content normal.  Vitals reviewed.     BP 102/80 (BP Location: Left Arm, Patient Position: Sitting, Cuff Size: Normal)   Pulse 76   Temp 98.3 F (36.8 C) (Temporal)   Ht 5\' 2"  (1.575 m)   Wt 177 lb 12.8 oz (80.6 kg)   SpO2 97%   BMI 32.52 kg/m  Wt Readings from Last 3 Encounters:  04/27/19 177 lb 12.8 oz (80.6 kg)  11/24/18 168 lb (76.2 kg)  04/14/17 180 lb (81.6 kg)       Assessment & Plan:  1. Mild intermittent asthma without complication -Gust disease process and importance of decreasing inflammation in the lungs.  Will find some  information regarding mail-order pharmacies for inhaled corticosteroid and passed on to her new PCP. - albuterol (VENTOLIN HFA) 108 (90 Base) MCG/ACT inhaler; Inhale 2 puffs into the lungs every 4 (four) hours as needed for wheezing or shortness of breath.  Dispense: 18 g; Refill: 0 Follow-up precautions reviewed  04/16/17, FNP-BC  [facility]  05/01/2019 8:08 AM

## 2019-04-27 NOTE — Telephone Encounter (Signed)
[facility] Night - Client Nonclinical Telephone Record AccessNurse Client [facility] Night - Client Client Site [facility] [place] - Night Physician Ruthe Mannan- MD Contact Type Call Who Is Calling Patient / Member / Family / Caregiver Caller Name Silvana Holecek Caller Phone Number [phone] Patient Name Kathryn Bush Patient DOB 07-06-1990 Call Type Message Only Information Provided Reason for Call Medication Question / Request Initial Comment Caller states she is needing a Rx refill for an inhaler. She is also needing an appointment Additional Comment pt declined triage, provided office hours Disp. Time Disposition Final User 04/27/2019 7:18:35 AM General Information Provided Yes Talmadge Coventry Call Closed By: Talmadge Coventry Transaction Date/Time: 04/27/2019 7:14:37 AM (ET)

## 2019-04-27 NOTE — Telephone Encounter (Signed)
Noted  

## 2019-05-11 ENCOUNTER — Encounter: Payer: Self-pay | Admitting: Family Medicine

## 2019-05-11 DIAGNOSIS — Z0289 Encounter for other administrative examinations: Secondary | ICD-10-CM

## 2019-05-15 ENCOUNTER — Other Ambulatory Visit: Payer: Self-pay | Admitting: Family Medicine

## 2019-05-15 DIAGNOSIS — J4520 Mild intermittent asthma, uncomplicated: Secondary | ICD-10-CM

## 2019-05-15 NOTE — Telephone Encounter (Signed)
See last OV with Kathryn Bush(acute) Pt was to establish care with Kathryn Bush on 05/11/19 but No Showed her appt.   Please advise, thanks.

## 2019-06-28 ENCOUNTER — Other Ambulatory Visit: Payer: Self-pay | Admitting: Family Medicine

## 2019-06-28 DIAGNOSIS — J4520 Mild intermittent asthma, uncomplicated: Secondary | ICD-10-CM

## 2019-07-24 ENCOUNTER — Other Ambulatory Visit: Payer: Self-pay | Admitting: Family Medicine

## 2019-07-24 DIAGNOSIS — J4520 Mild intermittent asthma, uncomplicated: Secondary | ICD-10-CM

## 2019-07-26 NOTE — Telephone Encounter (Signed)
Rx denied - patient No Showed her 05/11/19 TOC appt with Debbie.  No further refills until establish care visit is done.   Nothing further needed.

## 2019-08-17 ENCOUNTER — Telehealth: Payer: Self-pay | Admitting: General Practice

## 2019-08-17 NOTE — Telephone Encounter (Signed)
Removed Dr. Aron as patient's PCP due to her leaving this practice and patient has not been seen by her in 3 years.  

## 2019-12-13 ENCOUNTER — Telehealth: Payer: Self-pay

## 2019-12-13 NOTE — Telephone Encounter (Signed)
See Harlin Heys FNP comment in this phone note.

## 2019-12-13 NOTE — Telephone Encounter (Signed)
TOC appointment 12/6

## 2019-12-13 NOTE — Telephone Encounter (Signed)
[facility] Day - Client TELEPHONE ADVICE RECORD AccessNurse Patient Name: Kathryn Bush Gender: Female DOB: Aug 14, 1990 Age: 29 Y 1 M 21 D Return Phone Number: [phone] (Primary) Address: City/State/Zip: [place] [address] Client [facility] Day - Client Client Site [facility] - Day Physician Kathryn Bush- MD Contact Type Call Who Is Calling Patient / Member / Family / Caregiver Call Type Triage / Clinical Relationship To Patient Self Return Phone Number [phone] (Primary) Chief Complaint WHEEZING Reason for Call Symptomatic / Request for Health Information Initial Comment Caller is former pt of Kathryn Bush (who has left practice) and has not yet been reassigned. She is coughing and wheezing and her inhalers have run out. She is currently parked in the office parking lot, hoping to be seen by first available MD. Translation No Nurse Assessment Nurse: Kathryn Elizabeth, RN, Kathryn Bush Date/Time (Eastern Time): 12/13/2019 8:16:42 AM Confirm and document reason for call. If symptomatic, describe symptoms. ---Kathryn Bush states she is not having severe breathing difficulty or blueness around her lips at this time. She developed cough and wheezing about 2 days ago. No fever. No chest pain. Alert and responsive. She states she has been using her inhaler more frequently than every 4 hours. (Denies COVID exposure) Does the patient have any new or worsening symptoms? ---Yes Will a triage be completed? ---Yes Related visit to physician within the last 2 weeks? ---No Does the PT have any chronic conditions? (i.e. diabetes, asthma, this includes High risk factors for pregnancy, etc.) ---Yes List chronic conditions. ---Asthma Is the patient pregnant or possibly pregnant? (Ask all females between the ages of 65-55) ---No Is this a behavioral health or substance abuse call? ---No Guidelines Guideline Title Affirmed Question  Affirmed Notes Nurse Date/Time (Eastern Time) Asthma Attack Asthma medicine (nebulizer or inhaler) is needed more frequently than q 4 hours to keep you comfortable Kathryn Elizabeth, RN, Kathryn Bush 12/13/2019 8:19:21 AM PLEASE NOTE: All timestamps contained within this report are represented as [place] Standard Time. CONFIDENTIALTY NOTICE: This fax transmission is intended only for the addressee. It contains information that is legally privileged, confidential or otherwise protected from use or disclosure. If you are not the intended recipient, you are strictly prohibited from reviewing, disclosing, copying using or disseminating any of this information or taking any action in reliance on or regarding this information. If you have received this fax in error, please notify us immediately by telephone so that we can arrange for its return to [place]. Phone: [phone], Toll-Free: [phone], Fax: [phone] Page: 2 of 2 Call Id: [id number] Disp. Time Kathryn Bush Time) Disposition Final User 12/13/2019 8:12:12 AM Send to Urgent Kathryn Bush, Kathryn Bush 12/13/2019 8:21:00 AM Go to ED Now (or PCP triage) Yes Kathryn Elizabeth, RN, Kathryn Bush Disagree/Comply Disagree Caller Understands Yes PreDisposition Call Doctor Care Advice Given Per Guideline GO TO ED NOW (OR PCP TRIAGE): BRING MEDICINES: * Please bring a list of your current medicines when you go to see the doctor. * It is also a good idea to bring the pill bottles too. This will help the doctor to make certain you are taking the right medicines and the right dose. CARE ADVICE given per Asthma Attack (Adult) guideline. Comments User: Kathryn Bald, RN Date/Time Kathryn Bush Time): 12/13/2019 8:26:06 AM Kathryn Bush declines the Go to ER disposition and wants to be seen at the office to get her medications refilled. Called the office backline and connected Kathryn Bush with Kathryn Bush. User: Kathryn Bald, RN Date/Time Kathryn Bush Time): 12/13/2019 8:30:34  AM Kathryn Bush is parked in  the office parking lot at this time. Referrals GO TO FACILITY REFUSED

## 2019-12-13 NOTE — Telephone Encounter (Signed)
Agree with recommendations. She needs evaluation and follow up for her asthma. She had a transfer of care appointment earlier this year and did not show.

## 2019-12-13 NOTE — Telephone Encounter (Signed)
[facility] Night - Client Nonclinical Telephone Record AccessNurse Client [facility] Night - Client Client Site [facility] [place] - Night Physician Ruthe Mannan- MD Contact Type Call Who Is Calling Patient / Member / Family / Caregiver Caller Name ROXANNE PANEK Phone Number [phone] Patient Name Kathryn Bush Patient DOB 11-18-1990 Call Type Message Only Information Provided Reason for Call Request to Schedule Office Appointment Initial Comment Caller said her provider left the office and she needs to be reassigned. Caller said she needs a refill on her medication. Caller would like to schedule an appt. Disp. Time Disposition Final User 12/13/2019 7:42:04 AM General Information Provided Yes Gerrie Nordmann Call Closed By: Gerrie Nordmann Transaction Date/Time: 12/13/2019 7:38:52 AM (ET)

## 2019-12-13 NOTE — Telephone Encounter (Signed)
Kathryn Bush with access nurse has pt on phone; I spoke with pt; 2 days ago pt was out of Wixela and pt has been using the albuterol inhaler q2h. For 2 days pt has dry cough,SOB, wheezing,and last night pt had CP. No fever.pt is not in any distress now due to just using the inhaler.pt was Dr Elmer Sow pt and has not established or TOC with anyone but pt has seen Harlin Heys FNP. Pt does not necessarily want an appt but wants Wixela which [facility] and albuterol inhaler refilled. Per med list pt needed to be seen prior to more refills of albuterol inhaler. Advised pt needs to schedule TOC if wants to come to [facility] but for now pt [facility] go to [facility] for eval and any needed testing. [facility]. FYI to Harlin Heys FNP since last seen; no PCP at this time.

## 2019-12-13 NOTE — Telephone Encounter (Signed)
Ok to work her in sooner for TOC.

## 2019-12-14 NOTE — Telephone Encounter (Signed)
Left message asking pt to call office  °

## 2019-12-16 NOTE — Telephone Encounter (Signed)
10/27 pt aware

## 2019-12-16 NOTE — Telephone Encounter (Signed)
Left message asking pt to call office  °

## 2019-12-21 ENCOUNTER — Encounter: Payer: Self-pay | Admitting: Family Medicine

## 2020-01-27 ENCOUNTER — Encounter: Payer: Self-pay | Admitting: Family Medicine

## 2020-08-13 MED ORDER — BUDESONIDE-FORMOTEROL FUMARATE 160-4.5 MCG/ACT IN AERO: 2.0000 | INHALATION_SPRAY | Freq: Two times a day (BID) | RESPIRATORY_TRACT | 12 refills | Status: DC

## 2020-08-13 NOTE — Addendum Note (Signed)
Addended by: Damita Lack on: 08/13/2020 12:12 PM   Modules accepted: Orders

## 2021-03-02 NOTE — ED Notes (Signed)
Pt called twice for triage without answer, no longer visualized in lobby.
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
## 2021-03-03 ENCOUNTER — Emergency Department: Admission: EM | Admit: 2021-03-03 | Discharge: 2021-03-03 | Discharge status: Against Medical Advice | Payer: Self-pay

## 2021-03-03 NOTE — ED Notes (Signed)
No answer when called for triage 

## 2022-04-20 DIAGNOSIS — R000 Tachycardia, unspecified: Secondary | ICD-10-CM | POA: Diagnosis not present

## 2022-04-20 DIAGNOSIS — R55 Syncope and collapse: Secondary | ICD-10-CM | POA: Diagnosis not present

## 2022-11-29 DIAGNOSIS — Z6740 Type O blood, Rh positive: Secondary | ICD-10-CM | POA: Diagnosis not present

## 2022-11-29 DIAGNOSIS — R112 Nausea with vomiting, unspecified: Secondary | ICD-10-CM | POA: Diagnosis not present

## 2022-11-29 DIAGNOSIS — M5459 Other low back pain: Secondary | ICD-10-CM | POA: Diagnosis not present

## 2022-11-29 DIAGNOSIS — E441 Mild protein-calorie malnutrition: Secondary | ICD-10-CM | POA: Diagnosis not present

## 2022-11-29 DIAGNOSIS — S0993XA Unspecified injury of face, initial encounter: Secondary | ICD-10-CM | POA: Diagnosis not present

## 2022-11-29 DIAGNOSIS — R600 Localized edema: Secondary | ICD-10-CM | POA: Diagnosis not present

## 2022-11-29 DIAGNOSIS — S6991XA Unspecified injury of right wrist, hand and finger(s), initial encounter: Secondary | ICD-10-CM | POA: Diagnosis not present

## 2022-11-29 DIAGNOSIS — Z5181 Encounter for therapeutic drug level monitoring: Secondary | ICD-10-CM | POA: Diagnosis not present

## 2022-11-29 DIAGNOSIS — S22069A Unspecified fracture of T7-T8 vertebra, initial encounter for closed fracture: Secondary | ICD-10-CM | POA: Diagnosis not present

## 2022-11-29 DIAGNOSIS — M79643 Pain in unspecified hand: Secondary | ICD-10-CM | POA: Diagnosis not present

## 2022-11-29 DIAGNOSIS — S0031XA Abrasion of nose, initial encounter: Secondary | ICD-10-CM | POA: Diagnosis not present

## 2022-11-29 DIAGNOSIS — S0990XA Unspecified injury of head, initial encounter: Secondary | ICD-10-CM | POA: Diagnosis not present

## 2022-11-29 DIAGNOSIS — I10 Essential (primary) hypertension: Secondary | ICD-10-CM | POA: Diagnosis not present

## 2022-11-29 DIAGNOSIS — S299XXA Unspecified injury of thorax, initial encounter: Secondary | ICD-10-CM | POA: Diagnosis not present

## 2022-11-29 DIAGNOSIS — Y92410 Unspecified street and highway as the place of occurrence of the external cause: Secondary | ICD-10-CM | POA: Diagnosis not present

## 2022-11-29 DIAGNOSIS — T07XXXA Unspecified multiple injuries, initial encounter: Secondary | ICD-10-CM | POA: Diagnosis not present

## 2022-11-29 DIAGNOSIS — G44309 Post-traumatic headache, unspecified, not intractable: Secondary | ICD-10-CM | POA: Diagnosis not present

## 2022-11-29 DIAGNOSIS — Z743 Need for continuous supervision: Secondary | ICD-10-CM | POA: Diagnosis not present

## 2022-11-29 DIAGNOSIS — M25572 Pain in left ankle and joints of left foot: Secondary | ICD-10-CM | POA: Diagnosis not present

## 2022-11-29 DIAGNOSIS — S098XXA Other specified injuries of head, initial encounter: Secondary | ICD-10-CM | POA: Diagnosis not present

## 2022-11-29 DIAGNOSIS — S3991XA Unspecified injury of abdomen, initial encounter: Secondary | ICD-10-CM | POA: Diagnosis not present

## 2022-11-29 DIAGNOSIS — S59911A Unspecified injury of right forearm, initial encounter: Secondary | ICD-10-CM | POA: Diagnosis not present

## 2022-11-29 DIAGNOSIS — T1490XA Injury, unspecified, initial encounter: Secondary | ICD-10-CM | POA: Diagnosis not present

## 2022-11-29 DIAGNOSIS — S3993XA Unspecified injury of pelvis, initial encounter: Secondary | ICD-10-CM | POA: Diagnosis not present

## 2022-11-29 DIAGNOSIS — S6992XA Unspecified injury of left wrist, hand and finger(s), initial encounter: Secondary | ICD-10-CM | POA: Diagnosis not present

## 2022-11-29 DIAGNOSIS — S3992XA Unspecified injury of lower back, initial encounter: Secondary | ICD-10-CM | POA: Diagnosis not present

## 2022-11-29 DIAGNOSIS — Y33XXXA Other specified events, undetermined intent, initial encounter: Secondary | ICD-10-CM | POA: Diagnosis not present

## 2022-11-29 DIAGNOSIS — G8918 Other acute postprocedural pain: Secondary | ICD-10-CM | POA: Diagnosis not present

## 2022-11-29 DIAGNOSIS — M5134 Other intervertebral disc degeneration, thoracic region: Secondary | ICD-10-CM | POA: Diagnosis not present

## 2022-11-29 DIAGNOSIS — J320 Chronic maxillary sinusitis: Secondary | ICD-10-CM | POA: Diagnosis not present

## 2022-11-29 DIAGNOSIS — S199XXA Unspecified injury of neck, initial encounter: Secondary | ICD-10-CM | POA: Diagnosis not present

## 2022-11-29 DIAGNOSIS — Z87891 Personal history of nicotine dependence: Secondary | ICD-10-CM | POA: Diagnosis not present

## 2022-11-29 DIAGNOSIS — D62 Acute posthemorrhagic anemia: Secondary | ICD-10-CM | POA: Diagnosis not present

## 2022-11-29 DIAGNOSIS — J45909 Unspecified asthma, uncomplicated: Secondary | ICD-10-CM | POA: Diagnosis not present

## 2022-11-29 DIAGNOSIS — M5135 Other intervertebral disc degeneration, thoracolumbar region: Secondary | ICD-10-CM | POA: Diagnosis not present

## 2022-11-29 DIAGNOSIS — Z79899 Other long term (current) drug therapy: Secondary | ICD-10-CM | POA: Diagnosis not present

## 2022-11-29 DIAGNOSIS — J9811 Atelectasis: Secondary | ICD-10-CM | POA: Diagnosis not present

## 2022-11-30 DIAGNOSIS — R600 Localized edema: Secondary | ICD-10-CM | POA: Diagnosis not present

## 2022-11-30 DIAGNOSIS — J320 Chronic maxillary sinusitis: Secondary | ICD-10-CM | POA: Diagnosis not present

## 2022-11-30 DIAGNOSIS — S6991XA Unspecified injury of right wrist, hand and finger(s), initial encounter: Secondary | ICD-10-CM | POA: Diagnosis not present

## 2022-11-30 DIAGNOSIS — S299XXA Unspecified injury of thorax, initial encounter: Secondary | ICD-10-CM | POA: Diagnosis not present

## 2022-11-30 DIAGNOSIS — S199XXA Unspecified injury of neck, initial encounter: Secondary | ICD-10-CM | POA: Diagnosis not present

## 2022-11-30 DIAGNOSIS — S0990XA Unspecified injury of head, initial encounter: Secondary | ICD-10-CM | POA: Diagnosis not present

## 2022-11-30 DIAGNOSIS — S3991XA Unspecified injury of abdomen, initial encounter: Secondary | ICD-10-CM | POA: Diagnosis not present

## 2022-11-30 DIAGNOSIS — S3992XA Unspecified injury of lower back, initial encounter: Secondary | ICD-10-CM | POA: Diagnosis not present

## 2022-11-30 DIAGNOSIS — S59911A Unspecified injury of right forearm, initial encounter: Secondary | ICD-10-CM | POA: Diagnosis not present

## 2022-11-30 DIAGNOSIS — S3993XA Unspecified injury of pelvis, initial encounter: Secondary | ICD-10-CM | POA: Diagnosis not present

## 2022-11-30 DIAGNOSIS — T1490XA Injury, unspecified, initial encounter: Secondary | ICD-10-CM | POA: Diagnosis not present

## 2022-11-30 DIAGNOSIS — Y33XXXA Other specified events, undetermined intent, initial encounter: Secondary | ICD-10-CM | POA: Diagnosis not present

## 2022-11-30 DIAGNOSIS — S6992XA Unspecified injury of left wrist, hand and finger(s), initial encounter: Secondary | ICD-10-CM | POA: Diagnosis not present

## 2022-11-30 DIAGNOSIS — M5134 Other intervertebral disc degeneration, thoracic region: Secondary | ICD-10-CM | POA: Diagnosis not present

## 2022-11-30 DIAGNOSIS — S0993XA Unspecified injury of face, initial encounter: Secondary | ICD-10-CM | POA: Diagnosis not present

## 2022-12-26 DIAGNOSIS — Z419 Encounter for procedure for purposes other than remedying health state, unspecified: Secondary | ICD-10-CM | POA: Diagnosis not present

## 2022-12-29 DIAGNOSIS — M47816 Spondylosis without myelopathy or radiculopathy, lumbar region: Secondary | ICD-10-CM | POA: Diagnosis not present

## 2022-12-29 DIAGNOSIS — T1490XA Injury, unspecified, initial encounter: Secondary | ICD-10-CM | POA: Diagnosis not present

## 2022-12-29 DIAGNOSIS — M47812 Spondylosis without myelopathy or radiculopathy, cervical region: Secondary | ICD-10-CM | POA: Diagnosis not present

## 2022-12-29 DIAGNOSIS — S199XXA Unspecified injury of neck, initial encounter: Secondary | ICD-10-CM | POA: Diagnosis not present

## 2022-12-29 DIAGNOSIS — S3992XA Unspecified injury of lower back, initial encounter: Secondary | ICD-10-CM | POA: Diagnosis not present

## 2022-12-29 DIAGNOSIS — S299XXA Unspecified injury of thorax, initial encounter: Secondary | ICD-10-CM | POA: Diagnosis not present

## 2022-12-29 DIAGNOSIS — M4312 Spondylolisthesis, cervical region: Secondary | ICD-10-CM | POA: Diagnosis not present

## 2023-01-25 DIAGNOSIS — Z419 Encounter for procedure for purposes other than remedying health state, unspecified: Secondary | ICD-10-CM | POA: Diagnosis not present

## 2023-02-13 ENCOUNTER — Emergency Department: Payer: Medicaid Other

## 2023-02-13 ENCOUNTER — Other Ambulatory Visit: Payer: Self-pay

## 2023-02-13 ENCOUNTER — Inpatient Hospital Stay
Admission: EM | Admit: 2023-02-13 | Discharge: 2023-02-16 | DRG: 757 | Disposition: A | Discharge status: Home / Self Care | Payer: Medicaid Other | Attending: Obstetrics and Gynecology | Admitting: Obstetrics and Gynecology

## 2023-02-13 DIAGNOSIS — B9689 Other specified bacterial agents as the cause of diseases classified elsewhere: Secondary | ICD-10-CM | POA: Diagnosis present

## 2023-02-13 DIAGNOSIS — N9489 Other specified conditions associated with female genital organs and menstrual cycle: Principal | ICD-10-CM

## 2023-02-13 DIAGNOSIS — M542 Cervicalgia: Secondary | ICD-10-CM | POA: Diagnosis not present

## 2023-02-13 DIAGNOSIS — Z87891 Personal history of nicotine dependence: Secondary | ICD-10-CM | POA: Diagnosis not present

## 2023-02-13 DIAGNOSIS — Z885 Allergy status to narcotic agent status: Secondary | ICD-10-CM | POA: Diagnosis not present

## 2023-02-13 DIAGNOSIS — N854 Malposition of uterus: Secondary | ICD-10-CM | POA: Diagnosis not present

## 2023-02-13 DIAGNOSIS — R1030 Lower abdominal pain, unspecified: Secondary | ICD-10-CM | POA: Diagnosis not present

## 2023-02-13 DIAGNOSIS — N760 Acute vaginitis: Secondary | ICD-10-CM | POA: Diagnosis present

## 2023-02-13 DIAGNOSIS — G935 Compression of brain: Secondary | ICD-10-CM | POA: Diagnosis not present

## 2023-02-13 DIAGNOSIS — N7093 Salpingitis and oophoritis, unspecified: Secondary | ICD-10-CM | POA: Diagnosis not present

## 2023-02-13 DIAGNOSIS — E872 Acidosis, unspecified: Secondary | ICD-10-CM | POA: Diagnosis not present

## 2023-02-13 DIAGNOSIS — N83201 Unspecified ovarian cyst, right side: Secondary | ICD-10-CM | POA: Diagnosis not present

## 2023-02-13 DIAGNOSIS — R102 Pelvic and perineal pain: Secondary | ICD-10-CM

## 2023-02-13 DIAGNOSIS — N898 Other specified noninflammatory disorders of vagina: Secondary | ICD-10-CM | POA: Diagnosis not present

## 2023-02-13 DIAGNOSIS — R030 Elevated blood-pressure reading, without diagnosis of hypertension: Secondary | ICD-10-CM | POA: Diagnosis not present

## 2023-02-13 DIAGNOSIS — R110 Nausea: Secondary | ICD-10-CM | POA: Diagnosis not present

## 2023-02-13 DIAGNOSIS — M25559 Pain in unspecified hip: Secondary | ICD-10-CM | POA: Diagnosis present

## 2023-02-13 DIAGNOSIS — Z7951 Long term (current) use of inhaled steroids: Secondary | ICD-10-CM | POA: Diagnosis not present

## 2023-02-13 DIAGNOSIS — A419 Sepsis, unspecified organism: Secondary | ICD-10-CM | POA: Diagnosis not present

## 2023-02-13 DIAGNOSIS — J45909 Unspecified asthma, uncomplicated: Secondary | ICD-10-CM | POA: Diagnosis present

## 2023-02-13 DIAGNOSIS — N838 Other noninflammatory disorders of ovary, fallopian tube and broad ligament: Secondary | ICD-10-CM | POA: Diagnosis not present

## 2023-02-13 DIAGNOSIS — M2578 Osteophyte, vertebrae: Secondary | ICD-10-CM | POA: Diagnosis not present

## 2023-02-13 DIAGNOSIS — G9589 Other specified diseases of spinal cord: Secondary | ICD-10-CM | POA: Diagnosis not present

## 2023-02-13 HISTORY — DX: Salpingitis and oophoritis, unspecified: N70.93

## 2023-02-13 LAB — CBC WITH DIFFERENTIAL/PLATELET
Abs Immature Granulocytes: 0.03 10*3/uL (ref 0.00–0.07)
Basophils Absolute: 0.1 10*3/uL (ref 0.0–0.1)
Basophils Relative: 1 %
Eosinophils Absolute: 0.4 10*3/uL (ref 0.0–0.5)
Eosinophils Relative: 4 %
HCT: 33 % — ABNORMAL LOW (ref 36.0–46.0)
Hemoglobin: 11.1 g/dL — ABNORMAL LOW (ref 12.0–15.0)
Immature Granulocytes: 0 %
Lymphocytes Relative: 33 %
Lymphs Abs: 3.6 10*3/uL (ref 0.7–4.0)
MCH: 29.7 pg (ref 26.0–34.0)
MCHC: 33.6 g/dL (ref 30.0–36.0)
MCV: 88.2 fL (ref 80.0–100.0)
Monocytes Absolute: 0.9 10*3/uL (ref 0.1–1.0)
Monocytes Relative: 8 %
Neutro Abs: 5.9 10*3/uL (ref 1.7–7.7)
Neutrophils Relative %: 54 %
Platelets: 244 10*3/uL (ref 150–400)
RBC: 3.74 MIL/uL — ABNORMAL LOW (ref 3.87–5.11)
RDW: 15.7 % — ABNORMAL HIGH (ref 11.5–15.5)
WBC: 10.9 10*3/uL — ABNORMAL HIGH (ref 4.0–10.5)
nRBC: 0 % (ref 0.0–0.2)

## 2023-02-13 LAB — COMPREHENSIVE METABOLIC PANEL
ALT: 21 U/L (ref 0–44)
AST: 24 U/L (ref 15–41)
Albumin: 3.4 g/dL — ABNORMAL LOW (ref 3.5–5.0)
Alkaline Phosphatase: 43 U/L (ref 38–126)
Anion gap: 11 (ref 5–15)
BUN: 17 mg/dL (ref 6–20)
CO2: 23 mmol/L (ref 22–32)
Calcium: 8.3 mg/dL — ABNORMAL LOW (ref 8.9–10.3)
Chloride: 103 mmol/L (ref 98–111)
Creatinine, Ser: 0.73 mg/dL (ref 0.44–1.00)
GFR, Estimated: >60 mL/min (ref >60)
Glucose, Bld: 88 mg/dL (ref 70–99)
Potassium: 4.3 mmol/L (ref 3.5–5.1)
Sodium: 137 mmol/L (ref 135–145)
Total Bilirubin: 0.8 mg/dL (ref <1.2)
Total Protein: 6.5 g/dL (ref 6.5–8.1)

## 2023-02-13 LAB — URINALYSIS, ROUTINE W REFLEX MICROSCOPIC
Bacteria, UA: NONE SEEN
Bilirubin Urine: NEGATIVE
Glucose, UA: NEGATIVE mg/dL
Ketones, ur: NEGATIVE mg/dL
Leukocytes,Ua: NEGATIVE
Nitrite: NEGATIVE
Protein, ur: NEGATIVE mg/dL
Specific Gravity, Urine: 1.005 (ref 1.005–1.030)
pH: 6 (ref 5.0–8.0)

## 2023-02-13 LAB — PROTIME-INR
INR: 1.1 (ref 0.8–1.2)
Prothrombin Time: 14.3 s (ref 11.4–15.2)

## 2023-02-13 LAB — WET PREP, GENITAL
Sperm: NONE SEEN
Trich, Wet Prep: NONE SEEN
WBC, Wet Prep HPF POC: >=10 — AB (ref <10)
Yeast Wet Prep HPF POC: NONE SEEN

## 2023-02-13 LAB — HEPATITIS C ANTIBODY: HCV Ab: NONREACTIVE

## 2023-02-13 LAB — CHLAMYDIA/NGC RT PCR (ARMC ONLY)
Chlamydia Tr: NOT DETECTED
N gonorrhoeae: NOT DETECTED

## 2023-02-13 LAB — LACTIC ACID, PLASMA
Lactic Acid, Venous: 2.2 mmol/L (ref 0.5–1.9)
Lactic Acid, Venous: 2.1 mmol/L (ref 0.5–1.9)
Lactic Acid, Venous: 1.3 mmol/L (ref 0.5–1.9)

## 2023-02-13 LAB — HIV ANTIBODY (ROUTINE TESTING W REFLEX): HIV Screen 4th Generation wRfx: NONREACTIVE

## 2023-02-13 LAB — POC URINE PREG, ED: Preg Test, Ur: NEGATIVE

## 2023-02-13 LAB — APTT: aPTT: 24 s (ref 24–36)

## 2023-02-13 MED ORDER — ALBUTEROL SULFATE (2.5 MG/3ML) 0.083% IN NEBU
3.0000 mL | INHALATION_SOLUTION | RESPIRATORY_TRACT | Status: DC | PRN
Start: 2023-02-13 — End: 2023-02-14
  Administered 2023-02-13 – 2023-02-14 (×2): 3 mL via RESPIRATORY_TRACT
  Filled 2023-02-13 (×2): qty 3

## 2023-02-13 MED ORDER — DOXYCYCLINE HYCLATE 100 MG IV SOLR
100.0000 mg | Freq: Two times a day (BID) | INTRAVENOUS | Status: DC
  Administered 2023-02-13 – 2023-02-14 (×2): 100 mg via INTRAVENOUS
  Filled 2023-02-13 (×4): qty 100

## 2023-02-13 MED ORDER — KETOROLAC TROMETHAMINE 30 MG/ML IJ SOLN
15.0000 mg | Freq: Once | INTRAMUSCULAR | Status: AC
  Administered 2023-02-13: 15 mg via INTRAVENOUS
  Filled 2023-02-13: qty 1

## 2023-02-13 MED ORDER — KETOROLAC TROMETHAMINE 30 MG/ML IJ SOLN
30.0000 mg | Freq: Once | INTRAMUSCULAR | Status: AC
  Administered 2023-02-13: 30 mg via INTRAMUSCULAR
  Filled 2023-02-13: qty 1

## 2023-02-13 MED ORDER — AZITHROMYCIN 500 MG PO TABS
1000.0000 mg | ORAL_TABLET | Freq: Once | ORAL | Status: AC
  Administered 2023-02-13: 1000 mg via ORAL
  Filled 2023-02-13: qty 2

## 2023-02-13 MED ORDER — CEFTRIAXONE SODIUM 1 G IJ SOLR
500.0000 mg | Freq: Once | INTRAMUSCULAR | Status: AC
  Administered 2023-02-13: 500 mg via INTRAMUSCULAR
  Filled 2023-02-13: qty 10

## 2023-02-13 MED ORDER — ONDANSETRON HCL 4 MG PO TABS: 4.0000 mg | ORAL_TABLET | Freq: Four times a day (QID) | ORAL | Status: DC | PRN

## 2023-02-13 MED ORDER — METRONIDAZOLE 500 MG/100ML IV SOLN
500.0000 mg | Freq: Two times a day (BID) | INTRAVENOUS | Status: DC
  Administered 2023-02-13: 500 mg via INTRAVENOUS
  Filled 2023-02-13 (×2): qty 100

## 2023-02-13 MED ORDER — SODIUM CHLORIDE 0.9 % IV SOLN
1.0000 g | INTRAVENOUS | Status: DC
  Administered 2023-02-14 – 2023-02-16 (×3): 1 g via INTRAVENOUS
  Filled 2023-02-13 (×3): qty 10

## 2023-02-13 MED ORDER — METRONIDAZOLE 500 MG PO TABS
2000.0000 mg | ORAL_TABLET | Freq: Once | ORAL | Status: AC
  Administered 2023-02-13: 2000 mg via ORAL
  Filled 2023-02-13: qty 4

## 2023-02-13 MED ORDER — LACTATED RINGERS IV BOLUS
500.0000 mL | Freq: Once | INTRAVENOUS | Status: AC
  Administered 2023-02-13: 500 mL via INTRAVENOUS

## 2023-02-13 MED ORDER — LACTATED RINGERS IV SOLN: INTRAVENOUS | Status: AC

## 2023-02-13 MED ORDER — KETOROLAC TROMETHAMINE 30 MG/ML IJ SOLN
30.0000 mg | Freq: Four times a day (QID) | INTRAMUSCULAR | Status: DC
  Filled 2023-02-13: qty 1

## 2023-02-13 MED ORDER — LIDOCAINE HCL (PF) 1 % IJ SOLN
1.0000 mL | Freq: Once | INTRAMUSCULAR | Status: AC
Start: 2023-02-13 — End: 2023-02-13
  Administered 2023-02-13: 2.1 mL
  Filled 2023-02-13: qty 5

## 2023-02-13 MED ORDER — KETOROLAC TROMETHAMINE 15 MG/ML IJ SOLN
15.0000 mg | Freq: Once | INTRAMUSCULAR | Status: AC
  Administered 2023-02-13: 15 mg via INTRAVENOUS
  Filled 2023-02-13: qty 1

## 2023-02-13 MED ORDER — HYDROCODONE-ACETAMINOPHEN 5-325 MG PO TABS
1.0000 | ORAL_TABLET | ORAL | Status: DC | PRN
  Administered 2023-02-13 – 2023-02-14 (×5): 1 via ORAL
  Filled 2023-02-13 (×5): qty 1

## 2023-02-13 MED ORDER — KETOROLAC TROMETHAMINE 30 MG/ML IJ SOLN
30.0000 mg | Freq: Four times a day (QID) | INTRAMUSCULAR | Status: DC
  Administered 2023-02-13 – 2023-02-16 (×12): 30 mg via INTRAVENOUS
  Filled 2023-02-13 (×11): qty 1

## 2023-02-13 MED ORDER — ACETAMINOPHEN 500 MG PO TABS
1000.0000 mg | ORAL_TABLET | Freq: Four times a day (QID) | ORAL | Status: DC | PRN
  Administered 2023-02-13: 1000 mg via ORAL
  Filled 2023-02-13: qty 2

## 2023-02-13 MED ORDER — ONDANSETRON HCL 4 MG/2ML IJ SOLN
4.0000 mg | Freq: Four times a day (QID) | INTRAMUSCULAR | Status: DC | PRN
  Administered 2023-02-14 – 2023-02-16 (×6): 4 mg via INTRAVENOUS
  Filled 2023-02-13 (×6): qty 2

## 2023-02-13 MED ORDER — ONDANSETRON 4 MG PO TBDP
4.0000 mg | ORAL_TABLET | Freq: Once | ORAL | Status: AC
  Administered 2023-02-13: 4 mg via ORAL
  Filled 2023-02-13: qty 1

## 2023-02-13 NOTE — ED Notes (Signed)
Patient ambulated to hallway bathroom with a steady gait. Patient was asleep before IVs were placed and remains drowsy. Patient states she took cocaine two days ago and a "hit" off a marijuana pen last night, but mostly just drank.

## 2023-02-13 NOTE — ED Notes (Signed)
Patient has glasses, cell phone, purse, shoes, socks sweater, bra and pants with her upon admission. Patient was given hospital socks and states she will put them on upstairs.

## 2023-02-13 NOTE — ED Notes (Addendum)
Patient is alert and oriented x4 and is not claustrophobic.

## 2023-02-13 NOTE — ED Provider Notes (Signed)
US PELVIC COMPLETE W TRANSVAGINAL AND TORSION R/O Result Date: 02/13/2023 CLINICAL DATA:  Pelvic pain, adnexal mass seen on CT scan. EXAM: TRANSABDOMINAL AND TRANSVAGINAL ULTRASOUND OF PELVIS DOPPLER ULTRASOUND OF OVARIES TECHNIQUE: Both transabdominal and transvaginal ultrasound examinations of the pelvis were performed. Transabdominal technique was performed for global imaging of the pelvis including uterus, ovaries, adnexal regions, and pelvic cul-de-sac. It was necessary to proceed with endovaginal exam following the transabdominal exam to visualize the endometrium and ovaries. Color and duplex Doppler ultrasound was utilized to evaluate blood flow to the ovaries. COMPARISON:  CT scan of same day.  Ultrasound of November 24, 2018. FINDINGS: Uterus Measurements: 9.8 x 5.7 x 5.0 cm = volume: 149 mL. No fibroids or other mass visualized. Endometrium Thickness: 10 mm which is within normal limits. No focal abnormality visualized. Right ovary Not clearly visualized. Complex mass is noted with some degree of fluid. Serpiginous tubular structure is noted suggesting possible pyosalpinx. Left ovary Measurements: 3.8 x 2.2 x 2.1 cm = volume: 9 mL. Normal appearance/no adnexal mass. Pulsed Doppler evaluation of left ovary demonstrates normal low-resistance arterial and venous waveforms. Other findings Small amount of free fluid is noted which may be physiologic. IMPRESSION: Right ovary is not discretely visualized. Complex mass is noted with some degree of fluid which contains serpiginous tubular structure, suggesting the possibility of pyosalpinx. However, MRI is recommended to rule out neoplasm. No evidence of left ovarian torsion or mass. Electronically Signed   By: Lupita Raider M.D.   On: 02/13/2023 08:27   ----------------------------------------- 8:48 AM on 02/13/2023 -----------------------------------------  Patient assessed.  Updated her on results and concern for a possible pelvic infection or abscess  in or around the area of the right ovary and/or fallopian tube.  Patient understanding and agreeable with plan for admission.  I have consulted with Dr. Jean Rosenthal of our OB/GYN service advises OB team will be coming to see her and anticipates admission for further workup under the OB service due to the concerns noted by previous provider  Patient agreeable with plan for admission.  Have ordered CBC metabolic panel lactic acid blood cultures given plan for admission   Sharyn Creamer, MD 02/13/23 [phone]
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
## 2023-02-13 NOTE — ED Notes (Addendum)
Report given to Chief Executive Officer in [facility]. Lactic acid of 2.2 results was given to [facility], not this Clinical research associate. RN on floor states she will contact provider with results.

## 2023-02-13 NOTE — ED Notes (Signed)
Patient transported to MRI 
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
## 2023-02-14 DIAGNOSIS — N760 Acute vaginitis: Secondary | ICD-10-CM | POA: Diagnosis not present

## 2023-02-14 DIAGNOSIS — R110 Nausea: Secondary | ICD-10-CM | POA: Diagnosis not present

## 2023-02-14 DIAGNOSIS — J45909 Unspecified asthma, uncomplicated: Secondary | ICD-10-CM | POA: Diagnosis not present

## 2023-02-14 DIAGNOSIS — R030 Elevated blood-pressure reading, without diagnosis of hypertension: Secondary | ICD-10-CM | POA: Diagnosis not present

## 2023-02-14 DIAGNOSIS — N7093 Salpingitis and oophoritis, unspecified: Secondary | ICD-10-CM | POA: Diagnosis not present

## 2023-02-14 LAB — CBC
HCT: 31.8 % — ABNORMAL LOW (ref 36.0–46.0)
Hemoglobin: 10.8 g/dL — ABNORMAL LOW (ref 12.0–15.0)
MCH: 29.6 pg (ref 26.0–34.0)
MCHC: 34 g/dL (ref 30.0–36.0)
MCV: 87.1 fL (ref 80.0–100.0)
Platelets: 177 10*3/uL (ref 150–400)
RBC: 3.65 MIL/uL — ABNORMAL LOW (ref 3.87–5.11)
RDW: 15.7 % — ABNORMAL HIGH (ref 11.5–15.5)
WBC: 9.6 10*3/uL (ref 4.0–10.5)
nRBC: 0 % (ref 0.0–0.2)

## 2023-02-14 LAB — RPR: RPR Ser Ql: NONREACTIVE

## 2023-02-14 LAB — LACTIC ACID, PLASMA: Lactic Acid, Venous: 1.5 mmol/L (ref 0.5–1.9)

## 2023-02-14 LAB — HEPATITIS B E ANTIBODY: Hep B E Ab: NONREACTIVE

## 2023-02-14 MED ORDER — ALBUTEROL SULFATE (2.5 MG/3ML) 0.083% IN NEBU
2.5000 mg | INHALATION_SOLUTION | Freq: Four times a day (QID) | RESPIRATORY_TRACT | Status: DC | PRN
  Administered 2023-02-15 – 2023-02-16 (×3): 2.5 mg via RESPIRATORY_TRACT
  Filled 2023-02-14 (×3): qty 3

## 2023-02-14 MED ORDER — METRONIDAZOLE 500 MG PO TABS
500.0000 mg | ORAL_TABLET | Freq: Two times a day (BID) | ORAL | Status: DC
  Administered 2023-02-14 – 2023-02-16 (×5): 500 mg via ORAL
  Filled 2023-02-14 (×7): qty 1

## 2023-02-14 MED ORDER — PANTOPRAZOLE SODIUM 40 MG PO TBEC
40.0000 mg | DELAYED_RELEASE_TABLET | Freq: Every day | ORAL | Status: DC
Start: 2023-02-14 — End: 2023-02-16
  Administered 2023-02-15 – 2023-02-16 (×2): 40 mg via ORAL
  Filled 2023-02-14 (×2): qty 1

## 2023-02-14 MED ORDER — HYDROCODONE-ACETAMINOPHEN 5-325 MG PO TABS
1.0000 | ORAL_TABLET | ORAL | Status: DC | PRN
  Administered 2023-02-14 – 2023-02-16 (×14): 1 via ORAL
  Filled 2023-02-14 (×14): qty 1

## 2023-02-14 MED ORDER — ALBUTEROL SULFATE (2.5 MG/3ML) 0.083% IN NEBU: 2.5000 mg | INHALATION_SOLUTION | Freq: Four times a day (QID) | RESPIRATORY_TRACT | Status: DC | PRN

## 2023-02-14 MED ORDER — ALBUTEROL SULFATE HFA 108 (90 BASE) MCG/ACT IN AERS: 2.0000 | INHALATION_SPRAY | Freq: Four times a day (QID) | RESPIRATORY_TRACT | Status: DC

## 2023-02-14 MED ORDER — DOXYCYCLINE HYCLATE 100 MG PO TABS
100.0000 mg | ORAL_TABLET | Freq: Two times a day (BID) | ORAL | Status: DC
  Administered 2023-02-14 – 2023-02-16 (×5): 100 mg via ORAL
  Filled 2023-02-14 (×7): qty 1

## 2023-02-14 MED ORDER — SODIUM CHLORIDE 0.9 % IV SOLN: INTRAVENOUS | Status: AC | PRN
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
## 2023-02-15 DIAGNOSIS — N760 Acute vaginitis: Secondary | ICD-10-CM | POA: Diagnosis not present

## 2023-02-15 DIAGNOSIS — N7093 Salpingitis and oophoritis, unspecified: Secondary | ICD-10-CM | POA: Diagnosis not present

## 2023-02-15 DIAGNOSIS — J45909 Unspecified asthma, uncomplicated: Secondary | ICD-10-CM | POA: Diagnosis not present

## 2023-02-15 DIAGNOSIS — R110 Nausea: Secondary | ICD-10-CM | POA: Diagnosis not present

## 2023-02-15 DIAGNOSIS — R030 Elevated blood-pressure reading, without diagnosis of hypertension: Secondary | ICD-10-CM | POA: Diagnosis not present
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
## 2023-02-16 ENCOUNTER — Inpatient Hospital Stay: Payer: Medicaid Other

## 2023-02-16 DIAGNOSIS — N760 Acute vaginitis: Secondary | ICD-10-CM | POA: Diagnosis not present

## 2023-02-16 DIAGNOSIS — N854 Malposition of uterus: Secondary | ICD-10-CM | POA: Diagnosis not present

## 2023-02-16 DIAGNOSIS — N83201 Unspecified ovarian cyst, right side: Secondary | ICD-10-CM | POA: Diagnosis not present

## 2023-02-16 DIAGNOSIS — R030 Elevated blood-pressure reading, without diagnosis of hypertension: Secondary | ICD-10-CM | POA: Diagnosis not present

## 2023-02-16 DIAGNOSIS — R110 Nausea: Secondary | ICD-10-CM | POA: Diagnosis not present

## 2023-02-16 DIAGNOSIS — N7093 Salpingitis and oophoritis, unspecified: Secondary | ICD-10-CM | POA: Diagnosis not present

## 2023-02-16 DIAGNOSIS — J45909 Unspecified asthma, uncomplicated: Secondary | ICD-10-CM | POA: Diagnosis not present

## 2023-02-16 LAB — CBC WITH DIFFERENTIAL/PLATELET
Abs Immature Granulocytes: 0.02 10*3/uL (ref 0.00–0.07)
Basophils Absolute: 0 10*3/uL (ref 0.0–0.1)
Basophils Relative: 0 %
Eosinophils Absolute: 0.5 10*3/uL (ref 0.0–0.5)
Eosinophils Relative: 6 %
HCT: 34.2 % — ABNORMAL LOW (ref 36.0–46.0)
Hemoglobin: 11.4 g/dL — ABNORMAL LOW (ref 12.0–15.0)
Immature Granulocytes: 0 %
Lymphocytes Relative: 36 %
Lymphs Abs: 3 10*3/uL (ref 0.7–4.0)
MCH: 30 pg (ref 26.0–34.0)
MCHC: 33.3 g/dL (ref 30.0–36.0)
MCV: 90 fL (ref 80.0–100.0)
Monocytes Absolute: 0.7 10*3/uL (ref 0.1–1.0)
Monocytes Relative: 8 %
Neutro Abs: 4.2 10*3/uL (ref 1.7–7.7)
Neutrophils Relative %: 50 %
Platelets: 192 10*3/uL (ref 150–400)
RBC: 3.8 MIL/uL — ABNORMAL LOW (ref 3.87–5.11)
RDW: 15.2 % (ref 11.5–15.5)
WBC: 8.5 10*3/uL (ref 4.0–10.5)
nRBC: 0 % (ref 0.0–0.2)

## 2023-02-16 MED ORDER — CEFTRIAXONE SODIUM 1 G IJ SOLR: 1.0000 g | INTRAMUSCULAR | 0 refills | Status: AC

## 2023-02-16 MED ORDER — OXYCODONE HCL 5 MG PO TABS: 5.0000 mg | ORAL_TABLET | Freq: Three times a day (TID) | ORAL | 0 refills | Status: AC | PRN

## 2023-02-16 MED ORDER — DOXYCYCLINE HYCLATE 100 MG PO TABS: 100.0000 mg | ORAL_TABLET | Freq: Two times a day (BID) | ORAL | 0 refills | Status: AC

## 2023-02-16 MED ORDER — METRONIDAZOLE 500 MG PO TABS: 500.0000 mg | ORAL_TABLET | Freq: Two times a day (BID) | ORAL | 0 refills | Status: AC

## 2023-02-16 NOTE — Progress Notes (Addendum)
POD/HD #3  Subjective: 32 y.o. G2P1001 POD/HD #3. Patient reports she is doing good. Rating pain 7-8/10 during movement, but 6/10 when resting in bed. She states she feels "a lot better" this morning after eating breakfast. She is ambulating without difficulty, tolerating PO intake, voiding spontaneously, and her pain is well controlled on PO medications.   She denies CP, SOB, F/C, N/V/D, or leg pain.  Objective: BP 121/89 (BP Location: Left Arm)   Pulse 86   Temp 98.2 F (36.8 C) (Oral)   Resp 18   Ht 5\' 2"  (1.575 m)   Wt 86.2 kg   LMP 01/22/2023 (Approximate)   SpO2 98%   BMI 34.75 kg/m      02/16/2023    3:12 AM 02/15/2023   10:37 PM 02/15/2023    8:09 PM  Vitals with BMI  Systolic 121 151 244  Diastolic 89 107 100  Pulse 86 69 74    Intake/Output Summary (Last 24 hours) at 02/16/2023 0913 Last data filed at 02/15/2023 1800 Gross per 24 hour  Intake --  Output 900 ml  Net -900 ml      Physical Exam:  Cardiovascular: RRR Pulmonary: normal respiratory effort Abdomen: Benign. Non-tender, no guarding.  Extremities: No erythema or cords, no calf tenderness, with normal peripheral pulses.    Recent Labs    02/14/23 0515 02/16/23 0708  HGB 10.8* 11.4*  HCT 31.8* 34.2*  WBC 9.6 8.5  PLT 177 192    Assessment/Plan: 32 y.o. G2P1001 Hospital Day: 3  Pyosalpinx [N70.93] Neck pain [M54.2] Vaginal discharge [N89.8] Adnexal mass [N94.89] Pelvic pain [R10.2]   Plan of care reviewed with B.Beasley  1. Pyosalpinx  - Follow-up ultrasound completed today (02/16/2023) -Doxycyline 100 mg BID PO  -Rocephin 1 g  IV Q 24 hours -Metronidazole 500 mg BID PO   2. Bacterial Vaginosis -Covered by metronidazole   3. Elevated BP - BP normotensive this AM --> 121/89   4. Nausea - Zofran 4 mg Q 6 hours prn   6. Pain - Scheduled Toradol 30 mg IV or IM q6hrs  - Tylenol 1,000 mg PO q6hrs prn  - PRN Norco 1 tablet Q3hrs   7. Asthma - Albuterol inhalation q6hrs prn     8. +Drug use - No withdrawal symptoms present - Will continue to monitor for withdrawal symptoms and notify hospitialist and/or     psych for management   Disposition:  Continue inpatient care    LOS: 3 days   Neelah Mannings, CNM 02/16/2023, 9:10 AM   ----- Roney Jaffe Certified Nurse Midwife [facility]

## 2023-02-16 NOTE — Progress Notes (Signed)
Patient discharged home with family.  Discharge instructions, when to follow up, and prescriptions reviewed with patient.  Patient verbalized understanding. Patient will be escorted out by auxiliary.   

## 2023-02-16 NOTE — Plan of Care (Signed)
  Problem: Education: Goal: Knowledge of General Education information will improve Description: Including pain rating scale, medication(s)/side effects and non-pharmacologic comfort measures Outcome: Progressing   Problem: Clinical Measurements: Goal: Diagnostic test results will improve Outcome: Progressing   Problem: Pain Management: Goal: General experience of comfort will improve Outcome: Progressing   Problem: Safety: Goal: Ability to remain free from injury will improve Outcome: Progressing

## 2023-02-16 NOTE — Discharge Summary (Signed)
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  fluid or focal endometrial mass.  Right ovary  Measurements: 5.3 x 5.1 x 4.7 cm = volume: 67.0 mL. There is a 4.7  x 4.5 x 4.2 cm complex right ovarian cyst with heterogeneous internal echoes and no internal vascularity on color Doppler, favored to be increased in size from 3.4 x 2.4 x 2.8 cm on 02/13/2023 sonogram, favored to represent a hemorrhagic cyst. The previously described complex right adnexal mass is favored to represent a hemorrhagic right ovarian cyst in retrospect (heterogeneous hyperdense material was noted within the right adnexal structure on the 02/13/2023 noncontrast CT indicative of blood products).  Left ovary  Measurements: 2.8 x 1.8 x 1.9 cm = volume: 4.9 mL. Normal appearance/no adnexal mass.  Other findings:  Trace free fluid in the pelvic cul-de-sac.  IMPRESSION: 1. Complex 4.7 cm right ovarian cyst with heterogeneous internal echoes and no internal vascularity on color Doppler, most compatible with a hemorrhagic right ovarian cyst, increased in size in retrospect from 3.4 cm on recent 02/13/2023 sonogram, see comments. Recommend attention on follow-up pelvic sonogram in 6-12 weeks given the interval increase. 2. Trace free fluid in the pelvic cul-de-sac. 3. Normal uterus and left ovary.   Electronically Signed   By: Delbert Phenix M.D.   On: 02/16/2023 12:41       Treatments:  Antibiotics: metronidazole, Rocephin, and doxycycline  Analgesia: Toradol Q6hrs, Norco Q4hours PRN  Hospital Course:  Kamaree Schurz, a 32.y.o. was admitted with pyosalpinx. She received 4/14 doses of antibiotics during hospitalization. She was treated with rocephin IV, metronidazole IV, and doxycyline IV and switched to PO on HD#2. She was positive for bacterial vaginosis which was covered by metronidazole. UDS was positive for cocaine, ETOH, and opiates. No signs of withdrawal symptoms were observed during her hospital course. Her pain was managed by scheduled Toradol 15-30 mg IV or IM q 6 hrs, Norco prn 1 tablet q3 hrs, and Tyelenol 1,000 mg PO 6 hrs prn. Blood pressure ranged from  normotensive to mild range. No history of elevated blood pressures. Repeat imaging showed an increase in size (3.4 x 2.4 x 2.8 cm on 02/13/2023 to 5.3 x 5.1 x 4.7 cm= volume 67.0 mL on 02/16/2023). She was afebrile and no signs of sepsis. She was deemed stable for discharge with outpatient follow-up.   Review of Systems  Constitutional:  Negative for fever.  Respiratory: Negative.    Cardiovascular: Negative.   Gastrointestinal: Negative.   Genitourinary: Negative.   Psychiatric/Behavioral: Negative.      Discharge Physical Exam:  BP (!) 133/93 (BP Location: Right Arm) Comment: nurse Huston Foley notified  Pulse 74   Temp 97.7 F (36.5 C) (Oral)   Resp 18   Ht 5\' 2"  (1.575 m)   Wt 86.2 kg   LMP 01/22/2023 (Approximate)   SpO2 99%   BMI 34.75 kg/m    Physical Exam Constitutional:      Appearance: Normal appearance.  Genitourinary:     Genitourinary Comments: Deferred  HENT:     Head: Normocephalic.  Cardiovascular:     Rate and Rhythm: Normal rate.  Pulmonary:     Effort: Pulmonary effort is normal.  Abdominal:     General: There is no distension.     Palpations: Abdomen is soft.     Tenderness: There is abdominal tenderness. There is no guarding or rebound.  Musculoskeletal:        General: Normal range of motion.     Cervical back: Normal range of motion.  Neurological:  Mental Status: She is alert and oriented to person, place, and time.  Skin:    General: Skin is warm and dry.     Findings: No erythema.  Psychiatric:        Mood and Affect: Mood normal.        Behavior: Behavior normal.        Thought Content: Thought content normal.        Judgment: Judgment normal.     Plan: - Follow-up outpatient with OB provider in 2 weeks. - Repeat US in 6 weeks at [facility].  - Follow-up outpatient with neurosurgery as needed.  - Prescription for the following antibiotics sent to preferred pharmacy for completion of 14-day antibiotic course:   - Doxycycline 100 mg tablet PO  q12hrs for 10 days  - Metronidazole 500 mg tablet PO q12hrs for 10 days - Rocephin 1 g IM daily for 10 days - Oxycodone 5 mg q8 hrs for 10 days sent to preferred pharmacy for pain management.  - Patient verbalized understanding and agreed with plan of care. - Strict return precautions given.   - Plan of care reviewed with B.Beasley, MD.   Discharge Condition: Stable  Disposition: Discharge disposition: 01-Home or Self Care      Discharge Instructions     Discharge patient   Complete by: As directed    Discharge disposition: 01-Home or Self Care   Discharge patient date: 02/16/2023      Allergies as of 02/16/2023       Reactions   Morphine And Codeine Nausea And Vomiting        Medication List     TAKE these medications    acetaminophen 500 MG tablet Commonly known as: TYLENOL Take 500 mg by mouth every 6 (six) hours as needed.   albuterol 108 (90 Base) MCG/ACT inhaler Commonly known as: VENTOLIN HFA INHALE 2 PUFFS INTO THE LUNGS EVERY 4 (FOUR) HOURS AS NEEDED FOR WHEEZING OR SHORTNESS OF BREATH.   cefTRIAXone 1 g injection Commonly known as: ROCEPHIN Inject 1 g into the muscle daily for 10 days.   doxycycline 100 MG tablet Commonly known as: VIBRA-TABS Take 1 tablet (100 mg total) by mouth every 12 (twelve) hours for 10 days.   EasiVent inhaler Use with inhaler as instructed.   fluticasone 50 MCG/ACT nasal spray Commonly known as: FLONASE Place into the nose.   fluticasone-salmeterol 250-50 MCG/ACT Aepb Commonly known as: ADVAIR Inhale into the lungs.   ibuprofen 800 MG tablet Commonly known as: ADVIL Take 1 tablet (800 mg total) by mouth every 8 (eight) hours as needed for moderate pain.   metroNIDAZOLE 500 MG tablet Commonly known as: FLAGYL Take 1 tablet (500 mg total) by mouth every 12 (twelve) hours for 10 days.   oxyCODONE 5 MG immediate release tablet Commonly known as: Roxicodone Take 1 tablet (5 mg total) by mouth every 8 (eight)  hours as needed for up to 10 days.        Follow-up Information     Lovenia Kim, MD .   Specialty: Neurosurgery Contact information: [address] [place] [address] [phone]         Christeen Douglas, MD Follow up in 2 week(s).   Specialty: Obstetrics and Gynecology Why: for follow-up Contact information: [address] [place] [address] [phone]         [facility] ULTRASOUND Follow up in 6 week(s).   Specialty: Radiology Why: Repeat Ultrasound Contact  information: [address] [place] [address] [phone]                Signed:  Roney Jaffe, CNM 02/16/2023 9:12 PM

## 2023-02-18 LAB — CULTURE, BLOOD (ROUTINE X 2)
Culture: NO GROWTH
Culture: NO GROWTH

## 2023-02-20 ENCOUNTER — Other Ambulatory Visit: Payer: Self-pay | Admitting: Certified Nurse Midwife

## 2023-02-20 DIAGNOSIS — N7093 Salpingitis and oophoritis, unspecified: Secondary | ICD-10-CM

## 2023-02-25 DIAGNOSIS — Z419 Encounter for procedure for purposes other than remedying health state, unspecified: Secondary | ICD-10-CM | POA: Diagnosis not present

## 2023-02-27 NOTE — Progress Notes (Deleted)
 Referring Physician:  No referring provider defined for this encounter.  Primary Physician:  Pcp, No  History of Present Illness: 02/27/2023 Ms. Kathryn Bush is here today with a chief complaint of ***  Neck pain  Duration: *** Location: *** Quality: *** Severity: ***  Precipitating: aggravated by *** Modifying factors: made better by *** Weakness: none Timing: *** Bowel/Bladder Dysfunction: none  Conservative measures:  Physical therapy: has not participated in PT  Multimodal medical therapy including regular antiinflammatories: Flexeril, Tylenol , Ibuprofen  Injections: no epidural steroid injections  Past Surgery: none  VALLARIE FEI has ***no symptoms of cervical myelopathy.  The symptoms are causing a significant impact on the patient's life.   I have utilized the care everywhere function in epic to review the outside records available from external health systems.  Review of Systems:  A 10 point review of systems is negative, except for the pertinent positives and negatives detailed in the HPI.  Past Medical History: Past Medical History:  Diagnosis Date   Asthma    PID (acute pelvic inflammatory disease)    PID (acute pelvic inflammatory disease)     Past Surgical History: Past Surgical History:  Procedure Laterality Date   MANDIBLE RECONSTRUCTION      Allergies: Allergies as of 03/02/2023 - Review Complete 02/13/2023  Allergen Reaction Noted   Morphine  and codeine Nausea And Vomiting 10/17/2014    Medications:  Current Outpatient Medications:    acetaminophen  (TYLENOL ) 500 MG tablet, Take 500 mg by mouth every 6 (six) hours as needed., Disp: , Rfl:    albuterol  (VENTOLIN  HFA) 108 (90 Base) MCG/ACT inhaler, INHALE 2 PUFFS INTO THE LUNGS EVERY 4 (FOUR) HOURS AS NEEDED FOR WHEEZING OR SHORTNESS OF BREATH., Disp: 18 g, Rfl: 0   fluticasone  (FLONASE ) 50 MCG/ACT nasal spray, Place into the nose., Disp: , Rfl:    fluticasone -salmeterol  (ADVAIR) 250-50 MCG/ACT AEPB, Inhale into the lungs., Disp: , Rfl:    ibuprofen  (ADVIL ) 800 MG tablet, Take 1 tablet (800 mg total) by mouth every 8 (eight) hours as needed for moderate pain., Disp: 15 tablet, Rfl: 0   Spacer/Aero-Holding Chambers (EASIVENT) inhaler, Use with inhaler as instructed., Disp: , Rfl:   Social History: Social History   Tobacco Use   Smoking status: Former    Types: Cigarettes    Start date: 09/25/2011   Smokeless tobacco: Never  Substance Use Topics   Alcohol use: Yes    Comment: OCCASIONAL    Drug use: No    Family Medical History: Family History  Problem Relation Age of Onset   Cancer Mother 20       breast   Cancer Father        colon    Physical Examination: There were no vitals filed for this visit.  General: Patient is in no apparent distress. Attention to examination is appropriate.  Neck:   Supple.  Full range of motion.  Respiratory: Patient is breathing without any difficulty.   NEUROLOGICAL:     Awake, alert, oriented to person, place, and time.  Speech is clear and fluent.   Cranial Nerves: Pupils equal round and reactive to light.  Facial tone is symmetric.  Facial sensation is symmetric. Shoulder shrug is symmetric. Tongue protrusion is midline.    Strength: Side Biceps Triceps Deltoid Interossei Grip Wrist Ext. Wrist Flex.  R 5 5 5 5 5 5 5   L 5 5 5 5 5 5 5    Side Iliopsoas Quads Hamstring PF DF EHL  R 5 5 5 5 5 5   L 5 5 5 5 5 5    Reflexes are ***2+ and symmetric at the biceps, triceps, brachioradialis, patella and achilles.   Hoffman's is absent. Clonus is absent  Bilateral upper and lower extremity sensation is intact to light touch ***.     No evidence of dysmetria noted.  Gait is normal.    Imaging: Narrative & Impression  CLINICAL DATA:  Myelopathy, acute, cervical spine.   EXAM: MRI CERVICAL SPINE WITHOUT CONTRAST   TECHNIQUE: Multiplanar, multisequence MR imaging of the cervical spine was performed. No  intravenous contrast was administered.   COMPARISON:  Cervical spine CT from earlier today.   FINDINGS: Alignment: Normal   Vertebrae: Atlantooccipital non segmentation. No acute fracture, evidence of discitis, or bone lesion. Remote T3 superior endplate fracture without marrow edema.   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 No swelling.   Posterior Fossa, vertebral arteries, paraspinal tissues: Basilar invagination and hypoplastic lower clivus based on prior CT which covered the hard palate. The cerebellar tonsils descend to the level of the posterior arch of C1 with mild pointing.   Disc levels:   No herniation or impingement.   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     Electronically Signed   By: Dorn Roulette M.D.   On: 02/13/2023 05:53      I have personally reviewed the images and agree with the above interpretation.  Medical Decision Making/Assessment and Plan: Ms. Kathryn Bush is a pleasant 33 y.o. female with ***  Needs brain MRI   There are no diagnoses linked to this encounter.   Thank you for involving me in the care of this patient.    Penne MICAEL Sharps MD/MSCR Neurosurgery

## 2023-03-02 ENCOUNTER — Inpatient Hospital Stay
Admission: RE | Admit: 2023-03-02 | Discharge: 2023-03-02 | Disposition: A | Discharge status: Home / Self Care | Payer: Self-pay | Source: Ambulatory Visit | Attending: Neurosurgery | Admitting: Neurosurgery

## 2023-03-02 ENCOUNTER — Ambulatory Visit: Payer: Medicaid Other | Admitting: Neurosurgery

## 2023-03-02 ENCOUNTER — Other Ambulatory Visit: Payer: Self-pay | Admitting: Family Medicine

## 2023-03-02 DIAGNOSIS — Z049 Encounter for examination and observation for unspecified reason: Secondary | ICD-10-CM

## 2023-03-03 ENCOUNTER — Other Ambulatory Visit: Payer: Medicaid Other

## 2023-03-05 ENCOUNTER — Ambulatory Visit: Payer: Medicaid Other

## 2023-03-28 DIAGNOSIS — Z419 Encounter for procedure for purposes other than remedying health state, unspecified: Secondary | ICD-10-CM | POA: Diagnosis not present

## 2023-03-31 ENCOUNTER — Encounter: Payer: Self-pay | Admitting: Neurosurgery

## 2023-04-16 ENCOUNTER — Emergency Department
Admission: EM | Admit: 2023-04-16 | Discharge: 2023-04-16 | Disposition: A | Discharge status: Home / Self Care | Payer: Medicaid Other | Attending: Emergency Medicine | Admitting: Emergency Medicine

## 2023-04-16 ENCOUNTER — Emergency Department: Payer: Medicaid Other

## 2023-04-16 ENCOUNTER — Other Ambulatory Visit: Payer: Self-pay

## 2023-04-16 DIAGNOSIS — N83291 Other ovarian cyst, right side: Secondary | ICD-10-CM | POA: Diagnosis not present

## 2023-04-16 DIAGNOSIS — N888 Other specified noninflammatory disorders of cervix uteri: Secondary | ICD-10-CM | POA: Diagnosis not present

## 2023-04-16 DIAGNOSIS — N83201 Unspecified ovarian cyst, right side: Secondary | ICD-10-CM

## 2023-04-16 DIAGNOSIS — R1031 Right lower quadrant pain: Secondary | ICD-10-CM | POA: Diagnosis not present

## 2023-04-16 LAB — URINALYSIS, ROUTINE W REFLEX MICROSCOPIC
Bacteria, UA: NONE SEEN
Bilirubin Urine: NEGATIVE
Glucose, UA: NEGATIVE mg/dL
Ketones, ur: NEGATIVE mg/dL
Leukocytes,Ua: NEGATIVE
Nitrite: NEGATIVE
Protein, ur: NEGATIVE mg/dL
Specific Gravity, Urine: 1.017 (ref 1.005–1.030)
pH: 5 (ref 5.0–8.0)

## 2023-04-16 LAB — CBC
HCT: 36.8 % (ref 36.0–46.0)
Hemoglobin: 12.2 g/dL (ref 12.0–15.0)
MCH: 30 pg (ref 26.0–34.0)
MCHC: 33.2 g/dL (ref 30.0–36.0)
MCV: 90.6 fL (ref 80.0–100.0)
Platelets: 256 10*3/uL (ref 150–400)
RBC: 4.06 MIL/uL (ref 3.87–5.11)
RDW: 14.1 % (ref 11.5–15.5)
WBC: 9.9 10*3/uL (ref 4.0–10.5)
nRBC: 0 % (ref 0.0–0.2)

## 2023-04-16 LAB — COMPREHENSIVE METABOLIC PANEL
ALT: 25 U/L (ref 0–44)
AST: 26 U/L (ref 15–41)
Albumin: 3.8 g/dL (ref 3.5–5.0)
Alkaline Phosphatase: 51 U/L (ref 38–126)
Anion gap: 11 (ref 5–15)
BUN: 18 mg/dL (ref 6–20)
CO2: 22 mmol/L (ref 22–32)
Calcium: 9.5 mg/dL (ref 8.9–10.3)
Chloride: 101 mmol/L (ref 98–111)
Creatinine, Ser: 0.58 mg/dL (ref 0.44–1.00)
GFR, Estimated: >60 mL/min (ref >60)
Glucose, Bld: 100 mg/dL — ABNORMAL HIGH (ref 70–99)
Potassium: 4.1 mmol/L (ref 3.5–5.1)
Sodium: 134 mmol/L — ABNORMAL LOW (ref 135–145)
Total Bilirubin: 0.4 mg/dL (ref 0.0–1.2)
Total Protein: 6.9 g/dL (ref 6.5–8.1)

## 2023-04-16 LAB — PREGNANCY, URINE: Preg Test, Ur: NEGATIVE

## 2023-04-16 MED ORDER — OXYCODONE-ACETAMINOPHEN 5-325 MG PO TABS
1.0000 | ORAL_TABLET | Freq: Once | ORAL | Status: AC
  Administered 2023-04-16: 1 via ORAL
  Filled 2023-04-16: qty 1

## 2023-04-16 MED ORDER — ONDANSETRON HCL 4 MG/2ML IJ SOLN
4.0000 mg | Freq: Once | INTRAMUSCULAR | Status: AC
  Administered 2023-04-16: 4 mg via INTRAVENOUS
  Filled 2023-04-16: qty 2

## 2023-04-16 MED ORDER — HYDROCODONE-ACETAMINOPHEN 5-325 MG PO TABS
1.0000 | ORAL_TABLET | ORAL | 0 refills | Status: DC | PRN
Start: 2023-04-16 — End: 2023-07-23

## 2023-04-16 MED ORDER — FENTANYL CITRATE PF 50 MCG/ML IJ SOSY
50.0000 ug | PREFILLED_SYRINGE | Freq: Once | INTRAMUSCULAR | Status: AC
  Administered 2023-04-16: 50 ug via INTRAVENOUS
  Filled 2023-04-16 (×2): qty 1

## 2023-04-16 NOTE — ED Provider Notes (Signed)
 [facility] Provider Note    Event Date/Time   First MD Initiated Contact with Patient 04/16/23 1918     (approximate)  History   Chief Complaint: Abdominal Pain  HPI  Kathryn Bush is a 33 y.o. female with a past medical history of PID, pyosalpinx back in December who presents to the emergency department for right lower quadrant abdominal pain.  According to the patient back in December she was admitted to the hospital for an abscess within her abdomen.  I reviewed the patient's H&P and discharge summary she was admitted for suspected pyosalpinx placed on antibiotics and ultimately discharged with antibiotics.  Patient states she was supposed to follow-up with OB/GYN but she was never called and never made an appointment so she never followed up.  Patient states she has been experiencing intermittent pain since that time but acutely worse over the last couple days.  Patient made an appointment OB/GYN for tomorrow but states the pain flared up today so she came to the emergency department for evaluation.  Patient denies any fever.  No significant vaginal discharge.  No bleeding.  Physical Exam   Triage Vital Signs: ED Triage Vitals  Encounter Vitals Group     BP 04/16/23 1812 (!) 156/111     Systolic BP Percentile --      Diastolic BP Percentile --      Pulse Rate 04/16/23 1812 94     Resp 04/16/23 1812 17     Temp 04/16/23 1812 98.1 F (36.7 C)     Temp Source 04/16/23 1812 Oral     SpO2 04/16/23 1812 92 %     Weight 04/16/23 1813 170 lb (77.1 kg)     Height 04/16/23 1813 5\' 2"  (1.575 m)     Head Circumference --      Peak Flow --      Pain Score 04/16/23 1813 5     Pain Loc --      Pain Education --      Exclude from Growth Chart --     Most recent vital signs: Vitals:   04/16/23 1812  BP: (!) 156/111  Pulse: 94  Resp: 17  Temp: 98.1 F (36.7 C)  SpO2: 92%    General: Awake, no distress.  CV:  Good peripheral perfusion.  Regular rate  and rhythm  Resp:  Normal effort.  Equal breath sounds bilaterally.  Abd:  No distention.  Soft, mild right lower quadrant tenderness.  ED Results / Procedures / Treatments   RADIOLOGY  Ultrasound shows a 3.8 cm hemorrhagic cyst on the right ovary.  No other concerning lesion found.   MEDICATIONS ORDERED IN ED: Medications - No data to display   IMPRESSION / MDM / ASSESSMENT AND PLAN / ED COURSE  I reviewed the triage vital signs and the nursing notes.  Patient's presentation is most consistent with acute presentation with potential threat to life or bodily function.  Patient presents emergency department for pelvic pain/right lower quadrant pain.  Patient states she was admitted for a abdominal abscess which on record review appears to be a pyosalpinx.  Patient denies any recent fever denies vaginal discharge.  Patient's workup today shows a reassuring CBC with a normal white blood cell count, reassuring chemistry, normal urinalysis and overall normal vitals besides mild hypertension, afebrile.  Pregnancy test is negative.  Patient's ultrasound shows a 3.8 cm right hemorrhagic cyst but no other concerning lesions identified.  Highly suspect that the hemorrhagic  cyst as a cause for the patient's intermittent discomfort.  Patient has an appointment with OB/GYN tomorrow she will follow-up with them.  We will discharge with a short course of pain medication.  Patient agreeable to plan.  FINAL CLINICAL IMPRESSION(S) / ED DIAGNOSES   Hemorrhagic ovarian cyst  Rx / DC Orders   Norco  Note:  This document was prepared using Dragon voice recognition software and may include unintentional dictation errors.   Minna Antis, MD 04/16/23 2244

## 2023-04-16 NOTE — Discharge Instructions (Signed)
 Please follow-up with OB/GYN.  Return to the emergency department for any worsening pain or any symptom personally concerning to yourself.  Please take your pain medication as needed but only as prescribed.  Do not drink alcohol or drive while taking this medication.

## 2023-04-16 NOTE — ED Triage Notes (Signed)
 Pt sts that she was admitted back on 12/20 for an abd abscess. Pt sts that she was suppose to follow up with OBGYN and she has yet to do it. Pt sts that she has been having abd pain and is not able to handle it anymore so she decided to come in. Pt has a follow up tomorrow morning with OBGYN.

## 2023-04-25 DIAGNOSIS — Z419 Encounter for procedure for purposes other than remedying health state, unspecified: Secondary | ICD-10-CM | POA: Diagnosis not present

## 2023-05-15 DIAGNOSIS — N83209 Unspecified ovarian cyst, unspecified side: Secondary | ICD-10-CM | POA: Diagnosis not present

## 2023-06-06 DIAGNOSIS — Z419 Encounter for procedure for purposes other than remedying health state, unspecified: Secondary | ICD-10-CM | POA: Diagnosis not present

## 2023-06-25 DIAGNOSIS — R102 Pelvic and perineal pain: Secondary | ICD-10-CM | POA: Diagnosis not present

## 2023-06-25 DIAGNOSIS — N920 Excessive and frequent menstruation with regular cycle: Secondary | ICD-10-CM | POA: Diagnosis not present

## 2023-06-25 DIAGNOSIS — N83209 Unspecified ovarian cyst, unspecified side: Secondary | ICD-10-CM | POA: Diagnosis not present

## 2023-06-25 DIAGNOSIS — N80311 Superficial endometriosis of the anterior cul-de-sac: Secondary | ICD-10-CM | POA: Diagnosis not present

## 2023-06-25 DIAGNOSIS — G8929 Other chronic pain: Secondary | ICD-10-CM | POA: Diagnosis not present

## 2023-06-25 DIAGNOSIS — N83201 Unspecified ovarian cyst, right side: Secondary | ICD-10-CM | POA: Diagnosis not present

## 2023-06-25 DIAGNOSIS — N80112 Superficial endometriosis of left ovary: Secondary | ICD-10-CM | POA: Diagnosis not present

## 2023-07-06 DIAGNOSIS — Z419 Encounter for procedure for purposes other than remedying health state, unspecified: Secondary | ICD-10-CM | POA: Diagnosis not present

## 2023-07-23 ENCOUNTER — Other Ambulatory Visit: Payer: Self-pay

## 2023-07-23 ENCOUNTER — Ambulatory Visit (INDEPENDENT_AMBULATORY_CARE_PROVIDER_SITE_OTHER)

## 2023-07-23 ENCOUNTER — Telehealth: Payer: Self-pay

## 2023-07-23 VITALS — BP 126/84 | HR 91 | Temp 98.6°F | Ht 62.0 in | Wt 205.2 lb

## 2023-07-23 DIAGNOSIS — J4540 Moderate persistent asthma, uncomplicated: Secondary | ICD-10-CM

## 2023-07-23 DIAGNOSIS — J301 Allergic rhinitis due to pollen: Secondary | ICD-10-CM | POA: Insufficient documentation

## 2023-07-23 DIAGNOSIS — R0683 Snoring: Secondary | ICD-10-CM | POA: Insufficient documentation

## 2023-07-23 DIAGNOSIS — I10 Essential (primary) hypertension: Secondary | ICD-10-CM | POA: Insufficient documentation

## 2023-07-23 DIAGNOSIS — Z13220 Encounter for screening for lipoid disorders: Secondary | ICD-10-CM | POA: Diagnosis not present

## 2023-07-23 DIAGNOSIS — E66812 Obesity, class 2: Secondary | ICD-10-CM

## 2023-07-23 DIAGNOSIS — G2581 Restless legs syndrome: Secondary | ICD-10-CM

## 2023-07-23 DIAGNOSIS — Z131 Encounter for screening for diabetes mellitus: Secondary | ICD-10-CM

## 2023-07-23 DIAGNOSIS — Z6837 Body mass index (BMI) 37.0-37.9, adult: Secondary | ICD-10-CM

## 2023-07-23 DIAGNOSIS — E782 Mixed hyperlipidemia: Secondary | ICD-10-CM | POA: Diagnosis not present

## 2023-07-23 MED ORDER — BUDESONIDE-FORMOTEROL FUMARATE 80-4.5 MCG/ACT IN AERO
2.0000 | INHALATION_SPRAY | Freq: Two times a day (BID) | RESPIRATORY_TRACT | 3 refills | Status: DC
Start: 2023-07-23 — End: 2023-12-04
  Filled 2023-07-23: qty 10.2, 30d supply, fill #0
  Filled 2023-08-17: qty 10.2, 30d supply, fill #1
  Filled 2023-09-20: qty 10.2, 30d supply, fill #2
  Filled 2023-11-02: qty 10.2, 30d supply, fill #3

## 2023-07-23 MED ORDER — ALBUTEROL SULFATE HFA 108 (90 BASE) MCG/ACT IN AERS
2.0000 | INHALATION_SPRAY | Freq: Four times a day (QID) | RESPIRATORY_TRACT | 0 refills | Status: DC | PRN
Start: 2023-07-23 — End: 2023-08-18
  Filled 2023-07-23: qty 18, 25d supply, fill #0

## 2023-07-23 MED ORDER — FLUTICASONE PROPIONATE 50 MCG/ACT NA SUSP
1.0000 | Freq: Every day | NASAL | 3 refills | Status: DC
  Filled 2023-07-23: qty 16, 30d supply, fill #0
  Filled 2023-08-17: qty 16, 30d supply, fill #1
  Filled 2023-09-20: qty 16, 30d supply, fill #2
  Filled 2023-11-02: qty 16, 30d supply, fill #3

## 2023-07-23 NOTE — Assessment & Plan Note (Signed)
 Using albuterol  inhaler 3-4 times a day and 3-4 nights per month.  However patient also reports she has not had albuterol  inhaler prescription and has been using "friend's prescription".  Based on symptoms, uses of rescue inhaler follows under moderate persistent asthma.  Recommend referral to pulmonology for updating PFT.   Will start patient on Symbicort  2 puffs twice a day.  Albuterol  inhaler q6 hourly prn for rescue.  Counseled patient on medication adherence, risk of asthma exacerbation needing hospitalization if not compliant with treatment.

## 2023-07-23 NOTE — Telephone Encounter (Signed)
 Copied from CRM [id number]. Topic: General - Running Late >> Jul 23, 2023  2:51 PM [name] wrote: Patient/patient representative is calling because they are running late for an appointment. Patient went to [facility] location and will be arriving in 10 minutes.

## 2023-07-23 NOTE — Assessment & Plan Note (Signed)
 Screening for OSA: STOP-BANG score 4, symptoms suggestive of obstructive sleep apnea.  Recommend referral to pulmonology, sleep clinic for further evaluation.  Referral made today.

## 2023-07-23 NOTE — Assessment & Plan Note (Signed)
 Evident from lab from 12/07/2022. Repeat lipid panel today.  Patient was counseled on improving cholesterol levels through regular aerobic exercise, such as brisk walking or cycling for at least 150 minutes per week, and adopting a heart-healthy diet rich in fruits, vegetables, whole grains, and lean proteins while reducing saturated fats and cholesterol intake.

## 2023-07-23 NOTE — Telephone Encounter (Signed)
 Noted. Patient has arrived to her appointment with provider.

## 2023-07-23 NOTE — Assessment & Plan Note (Signed)
 Plan per hyperlipidemia + work towards 5-10% of body weight loss with lifestyle modifications.  Diffley discussed pharmacological intervention for weight loss if lifestyle modification does not help in achieving weight loss.  No h/o thyroid cancer in patient or in her family.  Recommend f/u in 8 weeks to reevaluate weight, BP.

## 2023-07-23 NOTE — Assessment & Plan Note (Addendum)
 D/D includes iron deficiency anemia, peripheral neuropathy, venous insuffiencey, nocturnal cramping, OSA, electrolytes abnormality. Will obtain CMP, magnesium, iron panel.   Recommend start wearing compression stocking 10 to 25 mmHg pressure when she is on her feet. Has tried Gabapentin without improvement in symptoms will consider starting patient on duloxetine 30 mg to help with restless legs and anxiety.  Further management pending lab results.
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
## 2023-07-23 NOTE — Assessment & Plan Note (Signed)
 Patient was advised to use Flonase nasal spray, one puff to each nostril daily for allergic rhinitis management, emphasizing proper administration technique, and aiming the spray away from the nasal septum to reduce irritation.

## 2023-07-23 NOTE — Patient Instructions (Signed)
--   Start compression stockings, wear throughout the day. Pressure 10-25 mmHg pressure.

## 2023-07-23 NOTE — Assessment & Plan Note (Signed)
 BP acceptable today. Elevated BP during her recent hospitalization likely related to pain. Recommend extensive lifestyle modifications including exercise, DASH diet, follow up in 8 weeks. We will check TSH, CMP.

## 2023-07-24 ENCOUNTER — Other Ambulatory Visit: Payer: Self-pay

## 2023-07-24 ENCOUNTER — Ambulatory Visit: Payer: Self-pay

## 2023-07-24 DIAGNOSIS — E611 Iron deficiency: Secondary | ICD-10-CM | POA: Insufficient documentation

## 2023-07-24 DIAGNOSIS — E559 Vitamin D deficiency, unspecified: Secondary | ICD-10-CM | POA: Insufficient documentation

## 2023-07-24 DIAGNOSIS — G2581 Restless legs syndrome: Secondary | ICD-10-CM

## 2023-07-24 DIAGNOSIS — E782 Mixed hyperlipidemia: Secondary | ICD-10-CM

## 2023-07-24 LAB — COMPREHENSIVE METABOLIC PANEL WITH GFR
ALT: 20 U/L (ref 0–35)
AST: 15 U/L (ref 0–37)
Albumin: 4.4 g/dL (ref 3.5–5.2)
Alkaline Phosphatase: 58 U/L (ref 39–117)
BUN: 16 mg/dL (ref 6–23)
CO2: 26 meq/L (ref 19–32)
Calcium: 9.9 mg/dL (ref 8.4–10.5)
Chloride: 102 meq/L (ref 96–112)
Creatinine, Ser: 0.73 mg/dL (ref 0.40–1.20)
GFR: 109.33 mL/min
Glucose, Bld: 100 mg/dL — ABNORMAL HIGH (ref 70–99)
Potassium: 4.4 meq/L (ref 3.5–5.1)
Sodium: 136 meq/L (ref 135–145)
Total Bilirubin: 0.3 mg/dL (ref 0.2–1.2)
Total Protein: 7.1 g/dL (ref 6.0–8.3)

## 2023-07-24 LAB — CBC WITH DIFFERENTIAL/PLATELET
Basophils Absolute: 0 10*3/uL (ref 0.0–0.1)
Basophils Relative: 0.3 % (ref 0.0–3.0)
Eosinophils Absolute: 0.3 10*3/uL (ref 0.0–0.7)
Eosinophils Relative: 3 % (ref 0.0–5.0)
HCT: 41.1 % (ref 36.0–46.0)
Hemoglobin: 13.5 g/dL (ref 12.0–15.0)
Lymphocytes Relative: 23.7 % (ref 12.0–46.0)
Lymphs Abs: 2.1 10*3/uL (ref 0.7–4.0)
MCHC: 32.7 g/dL (ref 30.0–36.0)
MCV: 88.2 fl (ref 78.0–100.0)
Monocytes Absolute: 0.7 10*3/uL (ref 0.1–1.0)
Monocytes Relative: 8 % (ref 3.0–12.0)
Neutro Abs: 5.9 10*3/uL (ref 1.4–7.7)
Neutrophils Relative %: 65 % (ref 43.0–77.0)
Platelets: 264 10*3/uL (ref 150.0–400.0)
RBC: 4.67 Mil/uL (ref 3.87–5.11)
RDW: 16.2 % — ABNORMAL HIGH (ref 11.5–15.5)
WBC: 9 10*3/uL (ref 4.0–10.5)

## 2023-07-24 LAB — LIPID PANEL
Cholesterol: 237 mg/dL — ABNORMAL HIGH (ref 0–200)
HDL: 41.1 mg/dL
LDL Cholesterol: 133 mg/dL — ABNORMAL HIGH (ref 0–99)
NonHDL: 195.44
Total CHOL/HDL Ratio: 6
Triglycerides: 314 mg/dL — ABNORMAL HIGH (ref 0.0–149.0)
VLDL: 62.8 mg/dL — ABNORMAL HIGH (ref 0.0–40.0)

## 2023-07-24 LAB — HEMOGLOBIN A1C: Hgb A1c MFr Bld: 5.6 % (ref 4.6–6.5)

## 2023-07-24 LAB — VITAMIN B12: Vitamin B-12: 376 pg/mL (ref 211–911)

## 2023-07-24 LAB — TSH: TSH: 1.8 u[IU]/mL (ref 0.35–5.50)

## 2023-07-24 LAB — MAGNESIUM: Magnesium: 1.7 mg/dL (ref 1.5–2.5)

## 2023-07-24 LAB — IRON,TIBC AND FERRITIN PANEL
%SAT: 10 % — ABNORMAL LOW (ref 16–45)
Ferritin: 25 ng/mL (ref 16–154)
Iron: 45 ug/dL (ref 40–190)
TIBC: 437 ug/dL (ref 250–450)

## 2023-07-24 LAB — VITAMIN D 25 HYDROXY (VIT D DEFICIENCY, FRACTURES): VITD: 21.03 ng/mL — ABNORMAL LOW (ref 30.00–100.00)

## 2023-07-24 MED ORDER — VITAMIN D (ERGOCALCIFEROL) 1.25 MG (50000 UNIT) PO CAPS
50000.0000 [IU] | ORAL_CAPSULE | ORAL | 0 refills | Status: DC
  Filled 2023-07-24: qty 8, 56d supply, fill #0

## 2023-07-24 MED ORDER — IRON (FERROUS SULFATE) 325 (65 FE) MG PO TABS
325.0000 mg | ORAL_TABLET | ORAL | 1 refills | Status: DC
  Filled 2023-07-24: qty 30, 70d supply, fill #0

## 2023-07-24 MED ORDER — MAGNESIUM CITRATE 200 MG PO TABS
200.0000 mg | ORAL_TABLET | Freq: Every day | ORAL | 0 refills | Status: DC
  Filled 2023-07-24 – 2023-09-20 (×2): qty 90, 90d supply, fill #0

## 2023-08-06 DIAGNOSIS — Z419 Encounter for procedure for purposes other than remedying health state, unspecified: Secondary | ICD-10-CM | POA: Diagnosis not present

## 2023-08-17 ENCOUNTER — Other Ambulatory Visit: Payer: Self-pay

## 2023-08-17 DIAGNOSIS — J4540 Moderate persistent asthma, uncomplicated: Secondary | ICD-10-CM

## 2023-08-18 ENCOUNTER — Other Ambulatory Visit: Payer: Self-pay

## 2023-08-18 MED FILL — Albuterol Sulfate Inhal Aero 108 MCG/ACT (90MCG Base Equiv): RESPIRATORY_TRACT | 25 days supply | Qty: 18 | Fill #0 | Status: AC

## 2023-08-20 ENCOUNTER — Other Ambulatory Visit: Payer: Self-pay

## 2023-08-25 DIAGNOSIS — Z0189 Encounter for other specified special examinations: Secondary | ICD-10-CM | POA: Diagnosis not present

## 2023-09-05 DIAGNOSIS — Z419 Encounter for procedure for purposes other than remedying health state, unspecified: Secondary | ICD-10-CM | POA: Diagnosis not present

## 2023-09-20 ENCOUNTER — Other Ambulatory Visit: Payer: Self-pay

## 2023-09-20 MED FILL — Albuterol Sulfate Inhal Aero 108 MCG/ACT (90MCG Base Equiv): RESPIRATORY_TRACT | 25 days supply | Qty: 18 | Fill #1 | Status: AC

## 2023-09-21 ENCOUNTER — Other Ambulatory Visit: Payer: Self-pay

## 2023-09-22 ENCOUNTER — Ambulatory Visit

## 2023-10-06 DIAGNOSIS — Z419 Encounter for procedure for purposes other than remedying health state, unspecified: Secondary | ICD-10-CM | POA: Diagnosis not present

## 2023-10-08 ENCOUNTER — Ambulatory Visit: Admitting: Internal Medicine

## 2023-10-13 ENCOUNTER — Ambulatory Visit

## 2023-10-13 ENCOUNTER — Telehealth: Payer: Self-pay

## 2023-10-13 VITALS — BP 118/80 | HR 85 | Temp 98.4°F | Ht 62.0 in | Wt 210.4 lb

## 2023-10-13 DIAGNOSIS — E611 Iron deficiency: Secondary | ICD-10-CM | POA: Diagnosis not present

## 2023-10-13 DIAGNOSIS — E782 Mixed hyperlipidemia: Secondary | ICD-10-CM | POA: Diagnosis not present

## 2023-10-13 DIAGNOSIS — F332 Major depressive disorder, recurrent severe without psychotic features: Secondary | ICD-10-CM | POA: Diagnosis not present

## 2023-10-13 DIAGNOSIS — E66812 Obesity, class 2: Secondary | ICD-10-CM | POA: Diagnosis not present

## 2023-10-13 DIAGNOSIS — F411 Generalized anxiety disorder: Secondary | ICD-10-CM | POA: Diagnosis not present

## 2023-10-13 DIAGNOSIS — E559 Vitamin D deficiency, unspecified: Secondary | ICD-10-CM

## 2023-10-13 DIAGNOSIS — G2581 Restless legs syndrome: Secondary | ICD-10-CM | POA: Diagnosis not present

## 2023-10-13 DIAGNOSIS — R0683 Snoring: Secondary | ICD-10-CM | POA: Diagnosis not present

## 2023-10-13 DIAGNOSIS — J4540 Moderate persistent asthma, uncomplicated: Secondary | ICD-10-CM | POA: Diagnosis not present

## 2023-10-13 DIAGNOSIS — R195 Other fecal abnormalities: Secondary | ICD-10-CM | POA: Insufficient documentation

## 2023-10-13 MED ORDER — VENLAFAXINE HCL ER 37.5 MG PO CP24: 37.5000 mg | ORAL_CAPSULE | Freq: Every day | ORAL | 1 refills | Status: DC

## 2023-10-13 MED ORDER — IRON (FERROUS SULFATE) 325 (65 FE) MG PO TABS: 325.0000 mg | ORAL_TABLET | ORAL | 3 refills | Status: DC

## 2023-10-13 MED ORDER — ZEPBOUND 2.5 MG/0.5ML ~~LOC~~ SOAJ: 2.5000 mg | SUBCUTANEOUS | 2 refills | Status: DC

## 2023-10-13 MED ORDER — VITAMIN B-12 1000 MCG PO TABS: 1000.0000 ug | ORAL_TABLET | Freq: Every day | ORAL | 3 refills | Status: AC

## 2023-10-13 MED ORDER — MAGNESIUM CITRATE 200 MG PO TABS: 1.0000 | ORAL_TABLET | Freq: Every day | ORAL | 3 refills | Status: DC | PRN

## 2023-10-13 MED ORDER — VITAMIN D3 25 MCG (1000 UT) PO CAPS: 1000.0000 [IU] | ORAL_CAPSULE | Freq: Every day | ORAL | 3 refills | Status: AC

## 2023-10-13 NOTE — Assessment & Plan Note (Signed)
 Patient did not follow-up with Dr. Jess due to personal reason.  She plans on calling her office to reschedule an appointment.  High suspicion for OSA given snoring, apnea episode noticed by her fianc during sleep.

## 2023-10-13 NOTE — Assessment & Plan Note (Signed)
-   No suicidal ideation. Counseling and behavioral interventions emphasized. - Start Effexor  (venlafaxine ) 37.5 mg daily.  Medication side effect discussed with the patient.  Immediate evaluation if she were to develop SI/HI. - Refer to psychiatry for medication management. - Refer to psychology for counseling. - Encourage exercise and routine. - Advise against cannabis use while on medication. - Follow-up in 8 weeks

## 2023-10-13 NOTE — Assessment & Plan Note (Addendum)
 Recommend continue taking vitamin D  supplement 1000 international units daily. Repeat vitamin D  level during follow-up visit.

## 2023-10-13 NOTE — Assessment & Plan Note (Signed)
 Continue Symbicort  as prescribed and as needed albuterol  as needed as symptoms has been well-controlled on this.
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
## 2023-10-13 NOTE — Assessment & Plan Note (Signed)
 Recommend continued lifestyle modification.  Repeat fasting lipid panel during follow-up visit.  Starting patient on Zepbound  with hope of weight loss and improvement in cholesterol level.

## 2023-10-13 NOTE — Assessment & Plan Note (Addendum)
-   No suicidal ideation. Counseling and behavioral interventions emphasized. - Start Effexor  (venlafaxine ) 37.5 mg daily.  Medication side effect discussed with the patient.  Immediate evaluation if she were to develop SI/HI. - Refer to psychiatry for medication management. - Refer to psychology for counseling. - Encourage exercise and routine. - Advise against cannabis use while on medication. - Follow-up in 8 weeks. - VBCI/case manager referral made.

## 2023-10-13 NOTE — Assessment & Plan Note (Signed)
 Tolerating ferrous sulfate  325 mg, 1 tablet every other day, continue Continue wearing compression stocking Continue vitamin B12 1000 mcg daily Recommend adding magnesium  citrate 200 mg daily

## 2023-10-13 NOTE — Assessment & Plan Note (Signed)
Continue ferrous sulfate 325 mg every other day.

## 2023-10-13 NOTE — Patient Instructions (Addendum)
 1) [facility]: [address] [place], [place] [address] Phone number: [phone]  Please check with your insurance to find counselor near you.  I am starting you on Effexor  37.5 mg, take one tablet daily.  If you have side effects like self harming thoughts please stop taking the medication and seek medical care emergently.  Please stop smoking weed.  Please schedule an appointment with Dr. Jess to evaluate for sleep apnea.  Please continue with regular exercise, eating healthy food.  Keep a food journal.  I am referring you to GI clinic to evaluate diarrhea. Try low FODMAP diet as attached.  2) I am starting you on weight loss medication called Zepbound  2.5 mg, once weekly injection. I recommend follow up in 8 weeks.

## 2023-10-13 NOTE — Assessment & Plan Note (Signed)
 Chronic loose stools.  TSH, CMP, CBC normal. Chronic loose stools possibly related to stress or IBS, affecting daily life.  Refer to GI for evaluation. Keep a symptom journal to identify triggers. Low FODMAP diet (printed information), stress management, regular exercise recommended.

## 2023-10-13 NOTE — Assessment & Plan Note (Signed)
 No family history of medullary thyroid  cancer, MEN, pancreatitis.  Patient is interested in pharmacological intervention as well as adding lifestyle modifications including diet and exercise.  Recommend starting Zepbound  2.5 mg once weekly after discussing potential risks and benefits of the medication.  Refill sent to the pharmacy.  Follow-up in 8 weeks for weight check, medication tolerance.

## 2023-10-14 ENCOUNTER — Other Ambulatory Visit: Payer: Self-pay

## 2023-10-14 ENCOUNTER — Telehealth: Payer: Self-pay

## 2023-10-14 ENCOUNTER — Other Ambulatory Visit (HOSPITAL_COMMUNITY): Payer: Self-pay

## 2023-10-14 DIAGNOSIS — E66812 Obesity, class 2: Secondary | ICD-10-CM

## 2023-10-14 MED ORDER — WEGOVY 0.25 MG/0.5ML ~~LOC~~ SOAJ
0.2500 mg | SUBCUTANEOUS | 2 refills | Status: AC
  Filled 2023-10-14 – 2023-10-16 (×2): qty 2, 28d supply, fill #0
  Filled 2023-11-17: qty 2, 28d supply, fill #1
  Filled 2023-12-03 – 2023-12-07 (×2): qty 2, 28d supply, fill #2

## 2023-10-14 NOTE — Progress Notes (Signed)
 Complex Care Management Note Care Guide Note  10/14/2023 Name: Kathryn Bush MRN: [id number] DOB: 1990/09/14   Complex Care Management Outreach Attempts: An unsuccessful telephone outreach was attempted today to offer the patient information about available complex care management services.  Follow Up Plan:  Additional outreach attempts will be made to offer the patient complex care management information and services.   Encounter Outcome:  No Answer  Leotis Rase [facility], [facility] Guide  Direct Dial: [phone]  Fax [phone]

## 2023-10-14 NOTE — Telephone Encounter (Signed)
 Pharmacy Patient Advocate Encounter   Received notification from Pt Calls Messages that prior authorization for Zepbound  2.5MG /0.5ML pen-injectors is required/requested.   Insurance verification completed.   The patient is insured through [facility] [place] .   Per test claim:  WEGOVY  is preferred by the insurance.  If suggested medication is appropriate, Please send in a new RX and discontinue this one. If not, please advise as to why it's not appropriate so that we may request a Prior Authorization. Please note, some preferred medications may still require a PA.  If the suggested medications have not been trialed and there are no contraindications to their use, the PA will not be submitted, as it will not be approved.

## 2023-10-14 NOTE — Telephone Encounter (Signed)
 1. Obesity, Class II, BMI 35-39.9, isolated (see actual BMI) (Primary) - semaglutide -weight management (WEGOVY ) 0.25 MG/0.5ML SOAJ SQ injection; Inject 0.25 mg into the skin once a week.  Dispense: 2 mL; Refill: 2 - D/C Zepbound .    Luke Shade, MD

## 2023-10-14 NOTE — Telephone Encounter (Signed)
 PA request has been Started. New Encounter has been or will be created for follow up. For additional info see Pharmacy Prior Auth telephone encounter from 10/14/23.

## 2023-10-15 ENCOUNTER — Telehealth: Payer: Self-pay

## 2023-10-15 ENCOUNTER — Other Ambulatory Visit (HOSPITAL_COMMUNITY): Payer: Self-pay

## 2023-10-15 NOTE — Telephone Encounter (Signed)
 PA request for Wegovy  has been Submitted. New Encounter has been or will be created for follow up. For additional info see Pharmacy Prior Auth telephone encounter from 10/15/23.

## 2023-10-15 NOTE — Telephone Encounter (Signed)
 Pharmacy Patient Advocate Encounter  Received notification from [facility] Medicaid that Prior Authorization for Wegovy  0.25MG /0.5ML auto-injectors has been APPROVED from 10/15/23 to 04/12/24. Ran test claim, Copay is $4. This test claim was processed through [facility]- copay amounts may vary at other pharmacies due to pharmacy/plan contracts, or as the patient moves through the different stages of their insurance plan.   PA #/Case ID/Reference #: [id number]

## 2023-10-15 NOTE — Telephone Encounter (Signed)
 Pharmacy Patient Advocate Encounter   Received notification from Pt Calls Messages that prior authorization for Wegovy  0.25MG /0.5ML auto-injectors is required/requested.   Insurance verification completed.   The patient is insured through [facility] [place] .   Per test claim: PA required; PA submitted to above mentioned insurance via Latent Key/confirmation #/EOC [id number] Status is pending

## 2023-10-16 ENCOUNTER — Other Ambulatory Visit: Payer: Self-pay

## 2023-10-19 NOTE — Progress Notes (Signed)
 Complex Care Management Note Care Guide Note  10/19/2023 Name: STARLEEN TRUSSELL MRN: [id number] DOB: Dec 31, 1990   Complex Care Management Outreach Attempts: A second unsuccessful outreach was attempted today to offer the patient with information about available complex care management services.  Follow Up Plan:  Additional outreach attempts will be made to offer the patient complex care management information and services.   Encounter Outcome:  No Answer  Leotis Rase [facility], [facility] Guide  Direct Dial: [phone]  Fax [phone]

## 2023-11-02 ENCOUNTER — Other Ambulatory Visit: Payer: Self-pay

## 2023-11-02 DIAGNOSIS — J4540 Moderate persistent asthma, uncomplicated: Secondary | ICD-10-CM

## 2023-11-03 ENCOUNTER — Other Ambulatory Visit: Payer: Self-pay

## 2023-11-03 MED FILL — Albuterol Sulfate Inhal Aero 108 MCG/ACT (90MCG Base Equiv): RESPIRATORY_TRACT | 25 days supply | Qty: 18 | Fill #0 | Status: AC

## 2023-11-03 NOTE — Telephone Encounter (Signed)
 1. Moderate persistent asthma without complication - albuterol  (VENTOLIN  HFA) 108 (90 Base) MCG/ACT inhaler; Inhale 2 puffs into the lungs every 6 (six) hours as needed for wheezing or shortness of breath.  Dispense: 18 g; Refill: 3  Luke Shade, MD

## 2023-11-04 ENCOUNTER — Other Ambulatory Visit: Payer: Self-pay

## 2023-11-06 DIAGNOSIS — Z419 Encounter for procedure for purposes other than remedying health state, unspecified: Secondary | ICD-10-CM | POA: Diagnosis not present

## 2023-11-16 DIAGNOSIS — Z124 Encounter for screening for malignant neoplasm of cervix: Secondary | ICD-10-CM | POA: Diagnosis not present

## 2023-11-16 DIAGNOSIS — G8929 Other chronic pain: Secondary | ICD-10-CM | POA: Diagnosis not present

## 2023-11-16 DIAGNOSIS — R102 Pelvic and perineal pain: Secondary | ICD-10-CM | POA: Diagnosis not present

## 2023-11-16 DIAGNOSIS — N939 Abnormal uterine and vaginal bleeding, unspecified: Secondary | ICD-10-CM | POA: Diagnosis not present

## 2023-12-03 ENCOUNTER — Other Ambulatory Visit: Payer: Self-pay

## 2023-12-03 DIAGNOSIS — J4540 Moderate persistent asthma, uncomplicated: Secondary | ICD-10-CM

## 2023-12-03 DIAGNOSIS — J301 Allergic rhinitis due to pollen: Secondary | ICD-10-CM

## 2023-12-03 MED FILL — Albuterol Sulfate Inhal Aero 108 MCG/ACT (90MCG Base Equiv): RESPIRATORY_TRACT | 25 days supply | Qty: 18 | Fill #1 | Status: AC

## 2023-12-04 ENCOUNTER — Other Ambulatory Visit: Payer: Self-pay

## 2023-12-04 DIAGNOSIS — J301 Allergic rhinitis due to pollen: Secondary | ICD-10-CM

## 2023-12-04 DIAGNOSIS — J4540 Moderate persistent asthma, uncomplicated: Secondary | ICD-10-CM

## 2023-12-04 MED FILL — Fluticasone Propionate Nasal Susp 50 MCG/ACT: NASAL | 30 days supply | Qty: 16 | Fill #0 | Status: AC

## 2023-12-04 MED FILL — Budesonide-Formoterol Fumarate Dihyd Aerosol 80-4.5 MCG/ACT: RESPIRATORY_TRACT | 30 days supply | Qty: 10.2 | Fill #0 | Status: AC

## 2023-12-07 ENCOUNTER — Other Ambulatory Visit: Payer: Self-pay

## 2023-12-08 ENCOUNTER — Telehealth: Payer: Self-pay

## 2023-12-08 ENCOUNTER — Other Ambulatory Visit: Payer: Self-pay

## 2023-12-08 NOTE — Telephone Encounter (Signed)
 Left VM and sent MyChart message to let pt kno Dr Abbey will not be in for their visit tomoorw, 12/09/23, and they will need to reschedule.   E2C2, please reschedule pt if they call back -kh

## 2023-12-09 ENCOUNTER — Ambulatory Visit

## 2024-01-04 MED FILL — Fluticasone Propionate Nasal Susp 50 MCG/ACT: NASAL | 30 days supply | Qty: 16 | Fill #1 | Status: AC

## 2024-01-04 MED FILL — Budesonide-Formoterol Fumarate Dihyd Aerosol 80-4.5 MCG/ACT: RESPIRATORY_TRACT | 30 days supply | Qty: 10.2 | Fill #1 | Status: AC

## 2024-01-06 DIAGNOSIS — Z419 Encounter for procedure for purposes other than remedying health state, unspecified: Secondary | ICD-10-CM | POA: Diagnosis not present

## 2024-01-12 ENCOUNTER — Other Ambulatory Visit: Payer: Self-pay

## 2024-01-12 DIAGNOSIS — F411 Generalized anxiety disorder: Secondary | ICD-10-CM

## 2024-01-12 DIAGNOSIS — F332 Major depressive disorder, recurrent severe without psychotic features: Secondary | ICD-10-CM

## 2024-01-12 MED ORDER — VENLAFAXINE HCL ER 37.5 MG PO CP24: 37.5000 mg | ORAL_CAPSULE | Freq: Every day | ORAL | 0 refills | Status: DC

## 2024-01-12 NOTE — Telephone Encounter (Signed)
 Patient requesting refill of Venlafaxine  XR 37.5mg  24 hr capsule.

## 2024-01-12 NOTE — Telephone Encounter (Signed)
 Copied from CRM #[id number]. Topic: Clinical - Medication Refill >> Jan 12, 2024  7:55 AM [name] wrote: Medication:   Has the patient contacted their pharmacy? Yes (Agent: If no, request that the patient contact the pharmacy for the refill. If patient does not wish to contact the pharmacy document the reason why and proceed with request.) (Agent: If yes, when and what did the pharmacy advise?)  This is the patient's preferred pharmacy:   [facility] [address] [place] - [name] [place] [address] Phone: [phone] Fax: [phone]  Is this the correct pharmacy for this prescription? Yes If no, delete pharmacy and type the correct one.   Has the prescription been filled recently? Yes  Is the patient out of the medication? No  Has the patient been seen for an appointment in the last year OR does the patient have an upcoming appointment? Yes  Can we respond through MyChart? Prefers phone call   Agent: Please be advised that Rx refills may take up to 3 business days. We ask that you follow-up with your pharmacy.

## 2024-02-05 DIAGNOSIS — Z419 Encounter for procedure for purposes other than remedying health state, unspecified: Secondary | ICD-10-CM | POA: Diagnosis not present

## 2024-02-10 ENCOUNTER — Other Ambulatory Visit: Payer: Self-pay

## 2024-02-10 MED FILL — Albuterol Sulfate Inhal Aero 108 MCG/ACT (90MCG Base Equiv): RESPIRATORY_TRACT | 25 days supply | Qty: 6.7 | Fill #2 | Status: AC

## 2024-02-10 MED FILL — Fluticasone Propionate Nasal Susp 50 MCG/ACT: NASAL | 30 days supply | Qty: 16 | Fill #2 | Status: AC

## 2024-02-10 MED FILL — Budesonide-Formoterol Fumarate Dihyd Aerosol 80-4.5 MCG/ACT: RESPIRATORY_TRACT | 30 days supply | Qty: 10.2 | Fill #2 | Status: AC

## 2024-02-23 NOTE — Addendum Note (Signed)
 Encounter addended by: Cleotilde Spadaccini L on: 02/23/2024 10:29 AM  Actions taken: Imaging Exam ended

## 2024-02-23 NOTE — Addendum Note (Signed)
 Encounter addended by: Janesa Dockery L on: 02/23/2024 10:28 AM  Actions taken: Imaging Exam ended

## 2024-02-23 NOTE — Addendum Note (Signed)
 Encounter addended by: Lawernce Earll L on: 02/23/2024 10:31 AM  Actions taken: Imaging Exam ended

## 2024-02-23 NOTE — Addendum Note (Signed)
 Encounter addended by: Vondell Sowell L on: 02/23/2024 10:30 AM  Actions taken: Imaging Exam ended

## 2024-03-01 ENCOUNTER — Other Ambulatory Visit (HOSPITAL_COMMUNITY): Payer: Self-pay

## 2024-03-01 MED FILL — Fluticasone Propionate Nasal Susp 50 MCG/ACT: NASAL | 30 days supply | Qty: 16 | Fill #3 | Status: CN

## 2024-03-01 MED FILL — Albuterol Sulfate Inhal Aero 108 MCG/ACT (90MCG Base Equiv): RESPIRATORY_TRACT | 25 days supply | Qty: 6.7 | Fill #3 | Status: CN

## 2024-03-02 ENCOUNTER — Other Ambulatory Visit: Payer: Self-pay

## 2024-03-02 ENCOUNTER — Other Ambulatory Visit (HOSPITAL_COMMUNITY): Payer: Self-pay

## 2024-03-03 ENCOUNTER — Encounter (HOSPITAL_COMMUNITY): Payer: Self-pay | Admitting: Pharmacist

## 2024-03-03 ENCOUNTER — Other Ambulatory Visit (HOSPITAL_COMMUNITY): Payer: Self-pay

## 2024-03-03 MED FILL — Fluticasone Propionate Nasal Susp 50 MCG/ACT: NASAL | 30 days supply | Qty: 16 | Fill #3 | Status: CN

## 2024-03-04 ENCOUNTER — Other Ambulatory Visit (HOSPITAL_COMMUNITY): Payer: Self-pay

## 2024-03-07 ENCOUNTER — Other Ambulatory Visit: Payer: Self-pay

## 2024-03-08 ENCOUNTER — Telehealth: Payer: Self-pay

## 2024-03-08 NOTE — Telephone Encounter (Signed)
 Copied from CRM #[id number]. Topic: Clinical - Medical Advice >> Mar 08, 2024  8:12 AM [name] ORN wrote: Reason for CRM: Patient called. She recently found out she was pregnant, and is currently on Venlafaxine  XR 37.5 MG 24 HR capsules, Flonase , and Albuterol  inhalers. Wants to know if this is safe to take while pregnant; if not, please call back the patient as soon as possible to discuss safer alternatives.

## 2024-03-10 ENCOUNTER — Other Ambulatory Visit: Payer: Self-pay

## 2024-03-10 DIAGNOSIS — J4540 Moderate persistent asthma, uncomplicated: Secondary | ICD-10-CM

## 2024-03-10 DIAGNOSIS — F411 Generalized anxiety disorder: Secondary | ICD-10-CM

## 2024-03-10 MED ORDER — SERTRALINE HCL 25 MG PO TABS
25.0000 mg | ORAL_TABLET | Freq: Every day | ORAL | 1 refills | Status: AC
  Filled 2024-03-10: qty 90, 90d supply, fill #0

## 2024-03-10 NOTE — Progress Notes (Signed)
 Talk to the patient on the phone.  she had 2 home pregnancy test positive.  She has upcoming appointment with OB/GYN at [facility] on 04/23/2024.  Since finding out about pregnancy she has stopped taking all over-the-counter medication and prescription medication including venlafaxine .  Since stopping venlafaxine  she has noted agitation, worsening anxiety.    Recommend stopping venlafaxine  due to potential risk of crossing placenta, and potential safety concern during pregnancy.  Given concern for anxiety, recommend treatment with sertraline  25 mg, take it with food.  Recommend patient reach out to our clinic if worsening mood. I also strongly recommend the patient to continue using Symbicort  for maintenance and albuterol  inhaler for rescue asthma management.  Recommend patient take daily prenatal vitamin with folic acid.   Recommend following up with  OB/GYN for pregnancy related questions, concerns as well as management of pregnancy.  1. Generalized anxiety disorder (Primary) - sertraline  (ZOLOFT ) 25 MG tablet; Take 1 tablet (25 mg total) by mouth daily.  Dispense: 90 tablet; Refill: 1 - Discontinue venlafaxine   2. Moderate persistent asthma without complication - sertraline  (ZOLOFT ) 25 MG tablet; Take 1 tablet (25 mg total) by mouth daily.  Dispense: 90 tablet; Refill: 1 - Discontinue venlafaxine    Luke Shade, MD

## 2024-03-10 NOTE — Telephone Encounter (Signed)
 Please refer to note from 03/10/2024.  Luke Shade, MD

## 2024-03-11 ENCOUNTER — Other Ambulatory Visit: Payer: Self-pay

## 2024-03-11 MED FILL — Fluticasone Propionate Nasal Susp 50 MCG/ACT: NASAL | 30 days supply | Qty: 16 | Fill #3 | Status: AC

## 2024-03-14 ENCOUNTER — Other Ambulatory Visit (HOSPITAL_COMMUNITY): Payer: Self-pay

## 2024-03-18 ENCOUNTER — Telehealth: Payer: Self-pay

## 2024-03-18 NOTE — Telephone Encounter (Signed)
 Left VM and MyChart message to let pt know visit has been changed from in-person to virtual due to inclement weather

## 2024-03-21 ENCOUNTER — Ambulatory Visit
